# Patient Record
Sex: Female | Born: 1945 | Race: Black or African American | Hispanic: No | Smoking: Current some day smoker
Health system: Southern US, Community
[De-identification: ages and names within clinical notes are randomized; demographics above are authoritative.]

## PROBLEM LIST (undated history)

## (undated) DIAGNOSIS — E7800 Pure hypercholesterolemia, unspecified: Secondary | ICD-10-CM

## (undated) DIAGNOSIS — M810 Age-related osteoporosis without current pathological fracture: Secondary | ICD-10-CM

## (undated) DIAGNOSIS — I10 Essential (primary) hypertension: Secondary | ICD-10-CM

## (undated) DIAGNOSIS — E079 Disorder of thyroid, unspecified: Secondary | ICD-10-CM

## (undated) HISTORY — DX: Age-related osteoporosis without current pathological fracture: M81.0

## (undated) HISTORY — PX: ROTATOR CUFF REPAIR: SHX139

---

## 1983-08-02 HISTORY — PX: ABDOMINAL HYSTERECTOMY: SHX81

## 1997-10-13 ENCOUNTER — Other Ambulatory Visit: Admission: RE | Admit: 1997-10-13 | Discharge: 1997-10-13 | Payer: Self-pay | Admitting: *Deleted

## 1998-12-30 ENCOUNTER — Inpatient Hospital Stay (HOSPITAL_COMMUNITY): Admission: RE | Admit: 1998-12-30 | Discharge: 1999-01-01 | Payer: Self-pay | Admitting: *Deleted

## 1999-07-27 ENCOUNTER — Other Ambulatory Visit: Admission: RE | Admit: 1999-07-27 | Discharge: 1999-07-27 | Payer: Self-pay | Admitting: *Deleted

## 2000-03-31 ENCOUNTER — Ambulatory Visit (HOSPITAL_BASED_OUTPATIENT_CLINIC_OR_DEPARTMENT_OTHER): Admission: RE | Admit: 2000-03-31 | Discharge: 2000-04-01 | Payer: Self-pay | Admitting: Orthopedic Surgery

## 2000-08-15 ENCOUNTER — Other Ambulatory Visit: Admission: RE | Admit: 2000-08-15 | Discharge: 2000-08-15 | Payer: Self-pay | Admitting: *Deleted

## 2004-09-17 ENCOUNTER — Ambulatory Visit: Payer: Self-pay | Admitting: Internal Medicine

## 2004-09-20 ENCOUNTER — Other Ambulatory Visit: Admission: RE | Admit: 2004-09-20 | Discharge: 2004-09-20 | Payer: Self-pay | Admitting: *Deleted

## 2004-09-29 ENCOUNTER — Ambulatory Visit (HOSPITAL_COMMUNITY): Admission: RE | Admit: 2004-09-29 | Discharge: 2004-09-29 | Payer: Self-pay | Admitting: Internal Medicine

## 2004-10-05 ENCOUNTER — Ambulatory Visit: Payer: Self-pay | Admitting: Internal Medicine

## 2005-10-11 ENCOUNTER — Other Ambulatory Visit: Admission: RE | Admit: 2005-10-11 | Discharge: 2005-10-11 | Payer: Self-pay | Admitting: *Deleted

## 2006-04-27 ENCOUNTER — Encounter: Admission: RE | Admit: 2006-04-27 | Discharge: 2006-04-27 | Payer: Self-pay | Admitting: Nephrology

## 2006-12-27 ENCOUNTER — Other Ambulatory Visit: Admission: RE | Admit: 2006-12-27 | Discharge: 2006-12-27 | Payer: Self-pay | Admitting: *Deleted

## 2008-02-05 ENCOUNTER — Other Ambulatory Visit: Admission: RE | Admit: 2008-02-05 | Discharge: 2008-02-05 | Payer: Self-pay | Admitting: Gynecology

## 2009-02-25 ENCOUNTER — Emergency Department (HOSPITAL_COMMUNITY): Admission: EM | Admit: 2009-02-25 | Discharge: 2009-02-25 | Payer: Self-pay | Admitting: Emergency Medicine

## 2010-11-07 LAB — POCT INFECTIOUS MONO SCREEN: Mono Screen: NEGATIVE

## 2010-12-02 ENCOUNTER — Other Ambulatory Visit: Payer: Self-pay | Admitting: Orthopedic Surgery

## 2010-12-02 DIAGNOSIS — M25512 Pain in left shoulder: Secondary | ICD-10-CM

## 2010-12-07 ENCOUNTER — Ambulatory Visit
Admission: RE | Admit: 2010-12-07 | Discharge: 2010-12-07 | Disposition: A | Discharge status: Home / Self Care | Payer: PRIVATE HEALTH INSURANCE | Source: Ambulatory Visit | Attending: Orthopedic Surgery | Admitting: Orthopedic Surgery

## 2010-12-07 DIAGNOSIS — M25512 Pain in left shoulder: Secondary | ICD-10-CM

## 2010-12-17 NOTE — Op Note (Signed)
[place]  Patient:    Tabitha Weaver, Tabitha Weaver                          MRN: [id number] Proc. Date: 03/31/00 Adm. Date:  [id number] Disc. 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  OPERATION: 1. Right shoulder examination with manipulation under anesthesia. 2. Arthroscopy with debridement of labrum and rotator cuff. 3. Arthroscopic acromioplasty with acromioclavicular ligament release. 4. Excision distal clavicle and open repair of chronic rotator cuff tear.  SURGEON:  Loreta Ave, M.D.  ASSISTANT:  Arlys John D. Petrarca, P.A.-C.  ANESTHESIA:  General.  ESTIMATED BLOOD LOSS:  Minimal.  SPECIMENS:  None.  CULTURES:  None.  COMPLICATIONS:  None.  DRESSING:  Soft compressive with shoulder immobilizer.  DESCRIPTION OF PROCEDURE:  The patient brought to the operating room and placed on the operating table in the supine position.  After adequate anesthesia had been obtained, right shoulder examined.  Mild to moderate restriction of motion in all planes especially over head from adhesions. Manipulated achieving full motion without difficulty and without undue occurrence.  Placed in a beach chair position on the shoulder positioner, prepped and draped in the usual sterile fashion.  Three standard arthroscopic portals anterior posterior lateral.  Shoulder entered with blunt obturator and extended and inspected.  Remnants of adhesions debrided.  Extensive tearing labrum circumferentially all debrided to a stable surface.  Complete  tearing of the entire supraspinatus through the crescent up to the level of the cable. Fortunately not much retraction.  Debrided from below.  The articular cartilage looked good.  Labrum, biceps anchor, biceps tendon all intact after the labrum tear was debrided.  Capsular ligamentous structures normal.  After the shoulder had been cleared, cannula redirected subacrominally.  Full thickness tear confirmed.  Bursa resected.  Type II to III acromion. Converted to a type I acromion with shaver and high speed bur.  Distal clavicle exposed contributing to impingement and grade IV changes.  Laterally a centimeter sharply resected.  Adequacy of decompression and clavicle excision confirmed view from all portals.  Instruments and fluid removed.  Lateral portal turned into a deltoid splitting incision.  Cup was debrided.  The marked chronic changes in the tuberosity were removed with a bur.  Trough created in the humerus adjacent to the tear.  The wound was irrigated.  Three #2 Ethilon sutures were weaved into the cuff well medial to the cable portion.  These were then brought through drill holes at the trough with the concept repair system.  Sutures were brought through these and over tied over bone, to give me a nice firm water tight closure of the cuff without undue tension even with full motion.  Adequacy of decompression confirmed digitally at the time of cuff repair.  Wound irrigated.  Deltoid split, closed with 0 Vicryl.  Skin and subcutaneous tissue with Vicryl and Steri-Strips.  Portals closed with nylon. Margins of the wound were injected with Marcaine as was the shoulder and bursa.  Sterile compressive dressing and  shoulder immobilizer applied. Anesthesia reversed and brought to the recovery room.  Tolerated the surgery well with no complications. DD:  03/31/00 TD:  04/03/00 Job: [id number]

## 2012-10-11 ENCOUNTER — Ambulatory Visit (HOSPITAL_COMMUNITY): Payer: BC Managed Care – PPO | Attending: Cardiovascular Disease

## 2012-10-11 ENCOUNTER — Other Ambulatory Visit (HOSPITAL_COMMUNITY): Payer: Self-pay | Admitting: Internal Medicine

## 2012-10-11 DIAGNOSIS — R9431 Abnormal electrocardiogram [ECG] [EKG]: Secondary | ICD-10-CM | POA: Insufficient documentation

## 2012-10-11 DIAGNOSIS — E785 Hyperlipidemia, unspecified: Secondary | ICD-10-CM | POA: Insufficient documentation

## 2012-10-11 DIAGNOSIS — I079 Rheumatic tricuspid valve disease, unspecified: Secondary | ICD-10-CM | POA: Insufficient documentation

## 2012-10-11 NOTE — Progress Notes (Signed)
Echocardiogram performed.  

## 2012-10-12 ENCOUNTER — Encounter (HOSPITAL_COMMUNITY): Payer: Self-pay | Admitting: Internal Medicine

## 2013-03-30 ENCOUNTER — Emergency Department (INDEPENDENT_AMBULATORY_CARE_PROVIDER_SITE_OTHER)
Admission: EM | Admit: 2013-03-30 | Discharge: 2013-03-30 | Disposition: A | Discharge status: Home / Self Care | Payer: BC Managed Care – PPO | Source: Home / Self Care | Attending: Family Medicine | Admitting: Family Medicine

## 2013-03-30 ENCOUNTER — Emergency Department (INDEPENDENT_AMBULATORY_CARE_PROVIDER_SITE_OTHER): Payer: BC Managed Care – PPO

## 2013-03-30 ENCOUNTER — Encounter (HOSPITAL_COMMUNITY): Payer: Self-pay | Admitting: Emergency Medicine

## 2013-03-30 DIAGNOSIS — M7552 Bursitis of left shoulder: Secondary | ICD-10-CM

## 2013-03-30 DIAGNOSIS — M67919 Unspecified disorder of synovium and tendon, unspecified shoulder: Secondary | ICD-10-CM

## 2013-03-30 HISTORY — DX: Disorder of thyroid, unspecified: E07.9

## 2013-03-30 HISTORY — DX: Pure hypercholesterolemia, unspecified: E78.00

## 2013-03-30 HISTORY — DX: Essential (primary) hypertension: I10

## 2013-03-30 MED ORDER — DICLOFENAC SODIUM 1 % TD GEL: 4.0000 g | Freq: Four times a day (QID) | TRANSDERMAL | Status: DC

## 2013-03-30 MED ORDER — DICLOFENAC SODIUM 1 % TD GEL: 4.0000 g | Freq: Three times a day (TID) | TRANSDERMAL | Status: DC

## 2013-03-30 NOTE — Discharge Instructions (Signed)
Ice pack, sling and medicine as prescribed,see orthopedist as scheduled.

## 2013-03-30 NOTE — ED Notes (Signed)
Pt c/o right shoulder/arm pain onset 4 months... Hx of rotator cuff repair Pain is getting worse... Increases when she raises arm above head, localized fever Denies: inj/trauma, strenuous activity, fevers, tingly/numbness Alert w/no signs of acute distress.

## 2013-03-30 NOTE — ED Provider Notes (Signed)
CSN: [id number]     Arrival date & time 03/30/13  1633 History   First MD Initiated Contact with Patient 03/30/13 1650     Chief Complaint  Patient presents with  . Shoulder Pain   (Consider location/radiation/quality/duration/timing/severity/associated sxs/prior Treatment) Patient is a 67 y.o. female presenting with shoulder pain. The history is provided by the patient.  Shoulder Pain This is a chronic problem. The current episode started more than 1 week ago (106mo.). The problem has been gradually worsening. Associated symptoms comments: S/p rotator cuff surg years ago..    Past Medical History  Diagnosis Date  . Hypertension   . Thyroid disease   . High cholesterol    Past Surgical History  Procedure Laterality Date  . Rotator cuff repair Right    No family history on file. History  Substance Use Topics  . Smoking status: Never Smoker   . Smokeless tobacco: Not on file  . Alcohol Use: No   OB History   Grav Para Term Preterm Abortions TAB SAB Ect Mult Living                 Review of Systems  Constitutional: Negative.   Musculoskeletal: Positive for arthralgias. Negative for joint swelling.  Skin: Negative.     Allergies  Review of patient's allergies indicates no known allergies.  Home Medications   Current Outpatient Rx  Name  Route  Sig  Dispense  Refill  . atorvastatin (LIPITOR) 20 MG tablet   Oral   Take 20 mg by mouth daily.         . Ergocalciferol (VITAMIN D2 PO)   Oral   Take by mouth.         . levothyroxine (SYNTHROID, LEVOTHROID) 112 MCG tablet   Oral   Take 112 mcg by mouth daily before breakfast.         . losartan (COZAAR) 100 MG tablet   Oral   Take 100 mg by mouth daily.         . diclofenac sodium (VOLTAREN) 1 % GEL   Topical   Apply 4 g topically 3 (three) times daily with meals.   100 g   3    BP 114/72  Pulse 108  Temp(Src) 98.6 F (37 C) (Oral)  Resp 18  SpO2 100% Physical Exam  Nursing note and vitals  reviewed. Constitutional: She is oriented to person, place, and time. She appears well-developed and well-nourished.  Musculoskeletal: She exhibits tenderness.       Right shoulder: She exhibits decreased range of motion, tenderness, bony tenderness, swelling and pain. She exhibits no effusion, no crepitus, no deformity, normal pulse and normal strength.       Arms: Neurological: She is alert and oriented to person, place, and time.  Skin: Skin is warm and dry.    ED Course  Procedures (including critical care time) Labs Review Labs Reviewed - No data to display Imaging Review Dg Shoulder Right  03/30/2013   *RADIOLOGY REPORT*  Clinical Data: Right shoulder pain for 3 months.  No known injury.  RIGHT SHOULDER - 2+ VIEW  Comparison: None.  Findings: The humerus is located.  There is no fracture.  The patient appears to be status post resection of the acromioclavicular joint.  Imaged right lung and ribs are unremarkable.  IMPRESSION: No acute finding.   Original Report Authenticated By: Holley Dexter, M.D.    MDM   1. Bursitis of shoulder, left       Fayrene Fearing  Sallyanne Kuster, MD 03/30/13 4098

## 2013-05-07 ENCOUNTER — Ambulatory Visit: Payer: Medicare Other | Attending: Orthopedic Surgery

## 2013-05-07 DIAGNOSIS — M25519 Pain in unspecified shoulder: Secondary | ICD-10-CM | POA: Insufficient documentation

## 2013-05-07 DIAGNOSIS — IMO0001 Reserved for inherently not codable concepts without codable children: Secondary | ICD-10-CM | POA: Insufficient documentation

## 2013-05-07 DIAGNOSIS — M25619 Stiffness of unspecified shoulder, not elsewhere classified: Secondary | ICD-10-CM | POA: Insufficient documentation

## 2013-05-09 ENCOUNTER — Ambulatory Visit: Payer: No Typology Code available for payment source

## 2013-05-13 ENCOUNTER — Ambulatory Visit: Payer: Medicare Other

## 2013-05-20 ENCOUNTER — Ambulatory Visit: Payer: Medicare Other

## 2013-06-03 ENCOUNTER — Ambulatory Visit: Payer: Medicare Other

## 2013-06-10 ENCOUNTER — Ambulatory Visit: Payer: Medicare Other | Attending: Orthopedic Surgery

## 2013-06-10 DIAGNOSIS — M25519 Pain in unspecified shoulder: Secondary | ICD-10-CM | POA: Insufficient documentation

## 2013-06-10 DIAGNOSIS — IMO0001 Reserved for inherently not codable concepts without codable children: Secondary | ICD-10-CM | POA: Insufficient documentation

## 2013-06-10 DIAGNOSIS — M25619 Stiffness of unspecified shoulder, not elsewhere classified: Secondary | ICD-10-CM | POA: Insufficient documentation

## 2013-10-14 ENCOUNTER — Encounter: Payer: Self-pay | Admitting: Gastroenterology

## 2013-11-13 ENCOUNTER — Ambulatory Visit (AMBULATORY_SURGERY_CENTER): Payer: Commercial Managed Care - HMO | Admitting: *Deleted

## 2013-11-13 VITALS — Ht 67.8 in | Wt 297.6 lb

## 2013-11-13 DIAGNOSIS — Z1211 Encounter for screening for malignant neoplasm of colon: Secondary | ICD-10-CM

## 2013-11-13 MED ORDER — MOVIPREP 100 G PO SOLR: ORAL | Status: DC

## 2013-11-13 NOTE — Progress Notes (Signed)
No allergies to eggs or soy. No problems with anesthesia.  Pt given Emmi instructions for colonoscopy  No oxygen use  No diet drug use  

## 2013-11-27 ENCOUNTER — Encounter: Payer: Self-pay | Admitting: Gastroenterology

## 2013-11-27 ENCOUNTER — Ambulatory Visit (AMBULATORY_SURGERY_CENTER): Payer: Commercial Managed Care - HMO | Admitting: Gastroenterology

## 2013-11-27 VITALS — BP 140/84 | HR 64 | Temp 96.6°F | Resp 29 | Ht 67.0 in | Wt 297.0 lb

## 2013-11-27 DIAGNOSIS — Z1211 Encounter for screening for malignant neoplasm of colon: Secondary | ICD-10-CM

## 2013-11-27 DIAGNOSIS — K5730 Diverticulosis of large intestine without perforation or abscess without bleeding: Secondary | ICD-10-CM

## 2013-11-27 MED ORDER — SODIUM CHLORIDE 0.9 % IV SOLN: 500.0000 mL | INTRAVENOUS | Status: DC

## 2013-11-27 NOTE — Op Note (Signed)
[facility] [address] [place], [id number]   COLONOSCOPY PROCEDURE REPORT  PATIENT: Tabitha HammersmithLanier, Jaritza P.  MR#: [id number] BIRTHDATE: [birth year] , 67  yrs. old GENDER: Female ENDOSCOPIST: Rachael Feeaniel P Brennan Karam, MD REFERRED XB:JYNWGBY:Scott Link SnufferHolwerda, M.D. PROCEDURE DATE:  11/27/2013 PROCEDURE:   Colonoscopy, screening First Screening Colonoscopy - Avg.  risk and is 50 yrs.  old or older - No.  Prior Negative Screening - Now for repeat screening. 10 or more years since last screening  History of Adenoma - Now for follow-up colonoscopy & has been > or = to 3 yrs.  N/A  Polyps Removed Today? No.  Recommend repeat exam, <10 yrs? No. ASA CLASS:   Class II INDICATIONS:average risk screening. MEDICATIONS: MAC sedation, administered by CRNA and propofol (Diprivan) 350mg  IV  DESCRIPTION OF PROCEDURE:   After the risks benefits and alternatives of the procedure were thoroughly explained, informed consent was obtained.  A digital rectal exam revealed no abnormalities of the rectum.   The LB [id number]  endoscope was introduced through the anus and advanced to the cecum, which was identified by both the appendix and ileocecal valve. No adverse events experienced.   The quality of the prep was excellent.  The instrument was then slowly withdrawn as the colon was fully examined.   COLON FINDINGS: The sigmoid colon was quite tortuous and contained numerous diverticulum.  The examination was othewise normal. Retroflexed views revealed no abnormalities. The time to cecum=5 minutes 44 seconds.  Withdrawal time=7 minutes 49 seconds.  The scope was withdrawn and the procedure completed. COMPLICATIONS: There were no complications.  ENDOSCOPIC IMPRESSION: The sigmoid colon was quite tortuous and contained numerous diverticulum. The examination was othewise normal.  RECOMMENDATIONS: You should continue to follow colorectal cancer screening guidelines for "routine risk" patients with a  repeat colonoscopy in 10 years.   eSigned:  Rachael Feeaniel P Delwyn Scoggin, MD 11/27/2013 11:24 AM

## 2013-11-27 NOTE — Patient Instructions (Signed)
YOU HAD AN ENDOSCOPIC PROCEDURE TODAY AT THE [facility]: Refer to the procedure report that was given to you for any specific questions about what was found during the examination.  If the procedure report does not answer your questions, please call your gastroenterologist to clarify.  If you requested that your care partner not be given the details of your procedure findings, then the procedure report has been included in a sealed envelope for you to review at your convenience later.  YOU SHOULD EXPECT: Some feelings of bloating in the abdomen. Passage of more gas than usual.  Walking can help get rid of the air that was put into your GI tract during the procedure and reduce the bloating. If you had a lower endoscopy (such as a colonoscopy or flexible sigmoidoscopy) you may notice spotting of blood in your stool or on the toilet paper. If you underwent a bowel prep for your procedure, then you may not have a normal bowel movement for a few days.  DIET: Your first meal following the procedure should be a light meal and then it is ok to progress to your normal diet.  A half-sandwich or bowl of soup is an example of a good first meal.  Heavy or fried foods are harder to digest and may make you feel nauseous or bloated.  Likewise meals heavy in dairy and vegetables can cause extra gas to form and this can also increase the bloating.  Drink plenty of fluids but you should avoid alcoholic beverages for 24 hours.  ACTIVITY: Your care partner should take you home directly after the procedure.  You should plan to take it easy, moving slowly for the rest of the day.  You can resume normal activity the day after the procedure however you should NOT DRIVE or use heavy machinery for 24 hours (because of the sedation medicines used during the test).    SYMPTOMS TO REPORT IMMEDIATELY: A gastroenterologist can be reached at any hour.  During normal business hours, 8:30 AM to 5:00 PM Monday through Friday,  call [phone].  After hours and on weekends, please call the GI answering service at [phone] who will take a message and have the physician on call contact you.   Following lower endoscopy (colonoscopy or flexible sigmoidoscopy):  Excessive amounts of blood in the stool  Significant tenderness or worsening of abdominal pains  Swelling of the abdomen that is new, acute  Fever of 100F or higher    FOLLOW UP: If any biopsies were taken you will be contacted by phone or by letter within the next 1-3 weeks.  Call your gastroenterologist if you have not heard about the biopsies in 3 weeks.  Our staff will call the home number listed on your records the next business day following your procedure to check on you and address any questions or concerns that you may have at that time regarding the information given to you following your procedure. This is a courtesy call and so if there is no answer at the home number and we have not heard from you through the emergency physician on call, we will assume that you have returned to your regular daily activities without incident.  SIGNATURES/CONFIDENTIALITY: You and/or your care partner have signed paperwork which will be entered into your electronic medical record.  These signatures attest to the fact that that the information above on your After Visit Summary has been reviewed and is understood.  Full responsibility of the confidentiality   of this discharge information lies with you and/or your care-partner.   Information on diverticulosis given to you today   

## 2013-11-28 ENCOUNTER — Telehealth: Payer: Self-pay | Admitting: *Deleted

## 2013-11-28 NOTE — Telephone Encounter (Signed)
  Follow up Call-  Call back number 11/27/2013  Post procedure Call Back phone  # [phone]  Permission to leave phone message Yes     Patient questions:  Do you have a fever, pain , or abdominal swelling? no Pain Score  0 *  Have you tolerated food without any problems? yes  Have you been able to return to your normal activities? yes  Do you have any questions about your discharge instructions: Diet   no Medications  no Follow up visit  no  Do you have questions or concerns about your Care? no  Actions: * If pain score is 4 or above: No action needed, pain <4.

## 2015-10-15 DIAGNOSIS — E559 Vitamin D deficiency, unspecified: Secondary | ICD-10-CM | POA: Diagnosis not present

## 2015-10-15 DIAGNOSIS — E784 Other hyperlipidemia: Secondary | ICD-10-CM | POA: Diagnosis not present

## 2015-10-15 DIAGNOSIS — R739 Hyperglycemia, unspecified: Secondary | ICD-10-CM | POA: Diagnosis not present

## 2015-10-15 DIAGNOSIS — R8290 Unspecified abnormal findings in urine: Secondary | ICD-10-CM | POA: Diagnosis not present

## 2015-10-15 DIAGNOSIS — I10 Essential (primary) hypertension: Secondary | ICD-10-CM | POA: Diagnosis not present

## 2015-10-15 DIAGNOSIS — E038 Other specified hypothyroidism: Secondary | ICD-10-CM | POA: Diagnosis not present

## 2015-10-15 DIAGNOSIS — N390 Urinary tract infection, site not specified: Secondary | ICD-10-CM | POA: Diagnosis not present

## 2015-10-21 DIAGNOSIS — Z1389 Encounter for screening for other disorder: Secondary | ICD-10-CM | POA: Diagnosis not present

## 2015-10-21 DIAGNOSIS — E784 Other hyperlipidemia: Secondary | ICD-10-CM | POA: Diagnosis not present

## 2015-10-21 DIAGNOSIS — E559 Vitamin D deficiency, unspecified: Secondary | ICD-10-CM | POA: Diagnosis not present

## 2015-10-21 DIAGNOSIS — Z0000 Encounter for general adult medical examination without abnormal findings: Secondary | ICD-10-CM | POA: Diagnosis not present

## 2015-10-21 DIAGNOSIS — Z23 Encounter for immunization: Secondary | ICD-10-CM | POA: Diagnosis not present

## 2015-10-21 DIAGNOSIS — M810 Age-related osteoporosis without current pathological fracture: Secondary | ICD-10-CM | POA: Diagnosis not present

## 2015-10-21 DIAGNOSIS — Z111 Encounter for screening for respiratory tuberculosis: Secondary | ICD-10-CM | POA: Diagnosis not present

## 2015-10-21 DIAGNOSIS — R7309 Other abnormal glucose: Secondary | ICD-10-CM | POA: Diagnosis not present

## 2015-10-21 DIAGNOSIS — I10 Essential (primary) hypertension: Secondary | ICD-10-CM | POA: Diagnosis not present

## 2015-10-21 DIAGNOSIS — Z6835 Body mass index (BMI) 35.0-35.9, adult: Secondary | ICD-10-CM | POA: Diagnosis not present

## 2015-10-21 DIAGNOSIS — N951 Menopausal and female climacteric states: Secondary | ICD-10-CM | POA: Diagnosis not present

## 2015-10-21 DIAGNOSIS — E038 Other specified hypothyroidism: Secondary | ICD-10-CM | POA: Diagnosis not present

## 2016-03-08 ENCOUNTER — Other Ambulatory Visit: Payer: Self-pay | Admitting: Internal Medicine

## 2016-03-08 DIAGNOSIS — R911 Solitary pulmonary nodule: Secondary | ICD-10-CM

## 2016-03-09 ENCOUNTER — Ambulatory Visit
Admission: RE | Admit: 2016-03-09 | Discharge: 2016-03-09 | Disposition: A | Discharge status: Home / Self Care | Payer: PPO | Source: Ambulatory Visit | Attending: Internal Medicine | Admitting: Internal Medicine

## 2016-03-09 DIAGNOSIS — R911 Solitary pulmonary nodule: Secondary | ICD-10-CM

## 2016-03-14 DIAGNOSIS — H2513 Age-related nuclear cataract, bilateral: Secondary | ICD-10-CM | POA: Diagnosis not present

## 2016-03-14 DIAGNOSIS — H40033 Anatomical narrow angle, bilateral: Secondary | ICD-10-CM | POA: Diagnosis not present

## 2016-04-18 DIAGNOSIS — R600 Localized edema: Secondary | ICD-10-CM | POA: Diagnosis not present

## 2016-04-18 DIAGNOSIS — Z23 Encounter for immunization: Secondary | ICD-10-CM | POA: Diagnosis not present

## 2016-04-18 DIAGNOSIS — I10 Essential (primary) hypertension: Secondary | ICD-10-CM | POA: Diagnosis not present

## 2016-04-18 DIAGNOSIS — E784 Other hyperlipidemia: Secondary | ICD-10-CM | POA: Diagnosis not present

## 2016-04-18 DIAGNOSIS — E1165 Type 2 diabetes mellitus with hyperglycemia: Secondary | ICD-10-CM | POA: Diagnosis not present

## 2016-04-18 DIAGNOSIS — Z6836 Body mass index (BMI) 36.0-36.9, adult: Secondary | ICD-10-CM | POA: Diagnosis not present

## 2016-04-21 DIAGNOSIS — Z124 Encounter for screening for malignant neoplasm of cervix: Secondary | ICD-10-CM | POA: Diagnosis not present

## 2016-04-21 DIAGNOSIS — Z01419 Encounter for gynecological examination (general) (routine) without abnormal findings: Secondary | ICD-10-CM | POA: Diagnosis not present

## 2016-04-21 DIAGNOSIS — Z6836 Body mass index (BMI) 36.0-36.9, adult: Secondary | ICD-10-CM | POA: Diagnosis not present

## 2016-04-21 DIAGNOSIS — Z1231 Encounter for screening mammogram for malignant neoplasm of breast: Secondary | ICD-10-CM | POA: Diagnosis not present

## 2016-05-06 ENCOUNTER — Ambulatory Visit (INDEPENDENT_AMBULATORY_CARE_PROVIDER_SITE_OTHER): Payer: PPO | Admitting: Podiatry

## 2016-05-06 ENCOUNTER — Ambulatory Visit (INDEPENDENT_AMBULATORY_CARE_PROVIDER_SITE_OTHER): Payer: PPO

## 2016-05-06 ENCOUNTER — Encounter: Payer: Self-pay | Admitting: Podiatry

## 2016-05-06 VITALS — BP 125/68 | HR 82 | Resp 16 | Ht 66.0 in | Wt 223.0 lb

## 2016-05-06 DIAGNOSIS — G629 Polyneuropathy, unspecified: Secondary | ICD-10-CM

## 2016-05-06 DIAGNOSIS — M79671 Pain in right foot: Secondary | ICD-10-CM

## 2016-05-06 DIAGNOSIS — M79672 Pain in left foot: Secondary | ICD-10-CM

## 2016-05-06 DIAGNOSIS — M722 Plantar fascial fibromatosis: Secondary | ICD-10-CM

## 2016-05-06 MED ORDER — TRIAMCINOLONE ACETONIDE 10 MG/ML IJ SUSP
10.0000 mg | Freq: Once | INTRAMUSCULAR | Status: AC
  Administered 2016-05-06: 10 mg

## 2016-05-06 NOTE — Progress Notes (Signed)
   Subjective:    Patient ID: Tabitha HammersmithGail P Reidel, female    DOB: [birth year], 70 y.o.   MRN: [id number]  HPI Chief Complaint  Patient presents with  . Foot Pain    bilateral ... 'the whole foot"  tingling in toes and bottoms of feet x 1 mo.        Review of Systems  Neurological: Positive for numbness.  All other systems reviewed and are negative.      Objective:   Physical Exam        Assessment & Plan:

## 2016-05-08 NOTE — Progress Notes (Signed)
Subjective:     Patient ID: Tabitha Weaver, female   DOB: [birth year], 70 y.o.   MRN: [id number]  HPI patient presents stating I have a lot of pain in my feet with what appears to be mostly in the heels but also my forefoot and I do have a history of diabetes that I don't think is under control and I do not watch it carefully   Review of Systems  All other systems reviewed and are negative.      Objective:   Physical Exam  Constitutional: She is oriented to person, place, and time.  Cardiovascular: Intact distal pulses.   Musculoskeletal: Normal range of motion.  Neurological: She is oriented to person, place, and time.  Skin: Skin is warm and dry.  Nursing note and vitals reviewed.  neurovascular status was noted to be intact with muscle strength found to be within normal limits with flatfoot deformity noted and patient found to have tenderness in the fascial band of the heels bilateral with mild forefoot discomfort also noted but not to the same intensity. Patient's noted to have good digital perfusion and is well oriented 3     Assessment:     At risk patient with inflammatory fasciitis bilateral flatfoot deformity and hopeful compensation of the pain in the midfoot and forefoot bilateral    Plan:     H&P condition reviewed and explained diabetic foot inspections and education. Today I injected the plantar fascia bilateral 3 mg Kenalog 5 mg Xylocaine and gave instructions on anti-inflammatories dispensed fascial brace is bilateral and instructed on physical therapy. Reappoint as symptoms indicate  X-rays indicated spur formation severe flattening of the arch with digital deformities noted

## 2016-05-09 DIAGNOSIS — J329 Chronic sinusitis, unspecified: Secondary | ICD-10-CM | POA: Diagnosis not present

## 2016-05-09 DIAGNOSIS — J029 Acute pharyngitis, unspecified: Secondary | ICD-10-CM | POA: Diagnosis not present

## 2016-05-26 ENCOUNTER — Encounter: Payer: Self-pay | Admitting: Podiatry

## 2016-05-26 ENCOUNTER — Ambulatory Visit (INDEPENDENT_AMBULATORY_CARE_PROVIDER_SITE_OTHER): Payer: PPO | Admitting: Podiatry

## 2016-05-26 DIAGNOSIS — M722 Plantar fascial fibromatosis: Secondary | ICD-10-CM

## 2016-05-26 DIAGNOSIS — G629 Polyneuropathy, unspecified: Secondary | ICD-10-CM

## 2016-05-26 DIAGNOSIS — M2040 Other hammer toe(s) (acquired), unspecified foot: Secondary | ICD-10-CM

## 2016-05-26 MED ORDER — GABAPENTIN 300 MG PO CAPS: 300.0000 mg | ORAL_CAPSULE | Freq: Three times a day (TID) | ORAL | 3 refills | Status: DC

## 2016-06-05 NOTE — Progress Notes (Signed)
Subjective:     Patient ID: Tabitha Weaver, female   DOB: Oct 20, 1945, 70 y.o.   MRN: [id number]  HPI patient presents stating that she still having a lot of pain and at this time the injections did not give her benefit   Review of Systems     Objective:   Physical Exam Neurovascular status unchanged with patient noted to have discomfort which is over a wide distribution with moderate heel pain arch pain and tingling-like discomfort with negative Tinel's sign noted    Assessment:     Probability for neuropathy versus acute inflammation or other condition with long-term diabetes    Plan:     I reviewed condition and at this point I do think consideration is there for trying gabapentin explaining to her risk and complications. She wants to try this will start her slowly at night and that I want her to build during the day to 3 a day and we also may need to consider neurological consult if symptoms were to persist or progress

## 2016-07-19 DIAGNOSIS — E038 Other specified hypothyroidism: Secondary | ICD-10-CM | POA: Diagnosis not present

## 2016-07-19 DIAGNOSIS — Z6833 Body mass index (BMI) 33.0-33.9, adult: Secondary | ICD-10-CM | POA: Diagnosis not present

## 2016-07-19 DIAGNOSIS — E1140 Type 2 diabetes mellitus with diabetic neuropathy, unspecified: Secondary | ICD-10-CM | POA: Diagnosis not present

## 2016-07-19 DIAGNOSIS — E1165 Type 2 diabetes mellitus with hyperglycemia: Secondary | ICD-10-CM | POA: Diagnosis not present

## 2016-07-19 DIAGNOSIS — E784 Other hyperlipidemia: Secondary | ICD-10-CM | POA: Diagnosis not present

## 2016-07-19 DIAGNOSIS — I10 Essential (primary) hypertension: Secondary | ICD-10-CM | POA: Diagnosis not present

## 2016-08-11 ENCOUNTER — Ambulatory Visit: Payer: PPO | Admitting: *Deleted

## 2016-08-11 DIAGNOSIS — G629 Polyneuropathy, unspecified: Secondary | ICD-10-CM

## 2016-08-11 NOTE — Progress Notes (Signed)
Patient ID: Tabitha HammersmithGail P Eller, female   DOB: 09-03-1945, 71 y.o.   MRN: [id number]   Patient presents to be scanned and measured for diabetic shoes and inserts.

## 2016-09-14 ENCOUNTER — Ambulatory Visit (INDEPENDENT_AMBULATORY_CARE_PROVIDER_SITE_OTHER): Payer: PPO | Admitting: Podiatry

## 2016-09-14 DIAGNOSIS — M2040 Other hammer toe(s) (acquired), unspecified foot: Secondary | ICD-10-CM

## 2016-09-14 DIAGNOSIS — E1142 Type 2 diabetes mellitus with diabetic polyneuropathy: Secondary | ICD-10-CM

## 2016-09-14 DIAGNOSIS — G629 Polyneuropathy, unspecified: Secondary | ICD-10-CM | POA: Diagnosis not present

## 2016-09-14 DIAGNOSIS — L84 Corns and callosities: Secondary | ICD-10-CM

## 2016-09-14 NOTE — Progress Notes (Signed)
Patient presents for diabetic shoe pick up, shoes are tried on for good fit.  Patient received 1 Pair and 3 pairs custom molded diabetic inserts.  Verbal and written break in and wear instructions given.  Patient will follow up for scheduled routine care.   

## 2016-09-14 NOTE — Patient Instructions (Signed)

## 2016-10-27 DIAGNOSIS — E038 Other specified hypothyroidism: Secondary | ICD-10-CM | POA: Diagnosis not present

## 2016-10-27 DIAGNOSIS — E559 Vitamin D deficiency, unspecified: Secondary | ICD-10-CM | POA: Diagnosis not present

## 2016-10-27 DIAGNOSIS — R8299 Other abnormal findings in urine: Secondary | ICD-10-CM | POA: Diagnosis not present

## 2016-10-27 DIAGNOSIS — E784 Other hyperlipidemia: Secondary | ICD-10-CM | POA: Diagnosis not present

## 2016-10-27 DIAGNOSIS — N390 Urinary tract infection, site not specified: Secondary | ICD-10-CM | POA: Diagnosis not present

## 2016-10-27 DIAGNOSIS — E1165 Type 2 diabetes mellitus with hyperglycemia: Secondary | ICD-10-CM | POA: Diagnosis not present

## 2016-10-27 DIAGNOSIS — I10 Essential (primary) hypertension: Secondary | ICD-10-CM | POA: Diagnosis not present

## 2016-11-03 DIAGNOSIS — E038 Other specified hypothyroidism: Secondary | ICD-10-CM | POA: Diagnosis not present

## 2016-11-03 DIAGNOSIS — M25562 Pain in left knee: Secondary | ICD-10-CM | POA: Diagnosis not present

## 2016-11-03 DIAGNOSIS — Z0000 Encounter for general adult medical examination without abnormal findings: Secondary | ICD-10-CM | POA: Diagnosis not present

## 2016-11-03 DIAGNOSIS — E559 Vitamin D deficiency, unspecified: Secondary | ICD-10-CM | POA: Diagnosis not present

## 2016-11-03 DIAGNOSIS — Z6833 Body mass index (BMI) 33.0-33.9, adult: Secondary | ICD-10-CM | POA: Diagnosis not present

## 2016-11-03 DIAGNOSIS — R911 Solitary pulmonary nodule: Secondary | ICD-10-CM | POA: Diagnosis not present

## 2016-11-03 DIAGNOSIS — E1140 Type 2 diabetes mellitus with diabetic neuropathy, unspecified: Secondary | ICD-10-CM | POA: Diagnosis not present

## 2016-11-03 DIAGNOSIS — I10 Essential (primary) hypertension: Secondary | ICD-10-CM | POA: Diagnosis not present

## 2016-11-03 DIAGNOSIS — E784 Other hyperlipidemia: Secondary | ICD-10-CM | POA: Diagnosis not present

## 2016-11-03 DIAGNOSIS — Z1389 Encounter for screening for other disorder: Secondary | ICD-10-CM | POA: Diagnosis not present

## 2016-11-03 DIAGNOSIS — M25551 Pain in right hip: Secondary | ICD-10-CM | POA: Diagnosis not present

## 2016-11-10 DIAGNOSIS — Z1212 Encounter for screening for malignant neoplasm of rectum: Secondary | ICD-10-CM | POA: Diagnosis not present

## 2017-02-14 DIAGNOSIS — E038 Other specified hypothyroidism: Secondary | ICD-10-CM | POA: Diagnosis not present

## 2017-02-14 DIAGNOSIS — Z6832 Body mass index (BMI) 32.0-32.9, adult: Secondary | ICD-10-CM | POA: Diagnosis not present

## 2017-02-14 DIAGNOSIS — E784 Other hyperlipidemia: Secondary | ICD-10-CM | POA: Diagnosis not present

## 2017-02-14 DIAGNOSIS — M25562 Pain in left knee: Secondary | ICD-10-CM | POA: Diagnosis not present

## 2017-02-14 DIAGNOSIS — I10 Essential (primary) hypertension: Secondary | ICD-10-CM | POA: Diagnosis not present

## 2017-02-14 DIAGNOSIS — N951 Menopausal and female climacteric states: Secondary | ICD-10-CM | POA: Diagnosis not present

## 2017-02-14 DIAGNOSIS — E1140 Type 2 diabetes mellitus with diabetic neuropathy, unspecified: Secondary | ICD-10-CM | POA: Diagnosis not present

## 2017-03-13 DIAGNOSIS — H40033 Anatomical narrow angle, bilateral: Secondary | ICD-10-CM | POA: Diagnosis not present

## 2017-03-13 DIAGNOSIS — E119 Type 2 diabetes mellitus without complications: Secondary | ICD-10-CM | POA: Diagnosis not present

## 2017-04-26 DIAGNOSIS — R232 Flushing: Secondary | ICD-10-CM | POA: Diagnosis not present

## 2017-04-26 DIAGNOSIS — Z01419 Encounter for gynecological examination (general) (routine) without abnormal findings: Secondary | ICD-10-CM | POA: Diagnosis not present

## 2017-04-26 DIAGNOSIS — Z6833 Body mass index (BMI) 33.0-33.9, adult: Secondary | ICD-10-CM | POA: Diagnosis not present

## 2017-04-26 DIAGNOSIS — Z1231 Encounter for screening mammogram for malignant neoplasm of breast: Secondary | ICD-10-CM | POA: Diagnosis not present

## 2017-04-26 DIAGNOSIS — M8588 Other specified disorders of bone density and structure, other site: Secondary | ICD-10-CM | POA: Diagnosis not present

## 2017-04-26 DIAGNOSIS — N958 Other specified menopausal and perimenopausal disorders: Secondary | ICD-10-CM | POA: Diagnosis not present

## 2017-05-08 DIAGNOSIS — E7849 Other hyperlipidemia: Secondary | ICD-10-CM | POA: Diagnosis not present

## 2017-05-08 DIAGNOSIS — I10 Essential (primary) hypertension: Secondary | ICD-10-CM | POA: Diagnosis not present

## 2017-05-08 DIAGNOSIS — Z23 Encounter for immunization: Secondary | ICD-10-CM | POA: Diagnosis not present

## 2017-05-08 DIAGNOSIS — Z6833 Body mass index (BMI) 33.0-33.9, adult: Secondary | ICD-10-CM | POA: Diagnosis not present

## 2017-05-08 DIAGNOSIS — E119 Type 2 diabetes mellitus without complications: Secondary | ICD-10-CM | POA: Diagnosis not present

## 2017-08-15 DIAGNOSIS — E1140 Type 2 diabetes mellitus with diabetic neuropathy, unspecified: Secondary | ICD-10-CM | POA: Diagnosis not present

## 2017-08-15 DIAGNOSIS — E7849 Other hyperlipidemia: Secondary | ICD-10-CM | POA: Diagnosis not present

## 2017-08-15 DIAGNOSIS — I10 Essential (primary) hypertension: Secondary | ICD-10-CM | POA: Diagnosis not present

## 2017-08-15 DIAGNOSIS — J328 Other chronic sinusitis: Secondary | ICD-10-CM | POA: Diagnosis not present

## 2017-08-15 DIAGNOSIS — Z6833 Body mass index (BMI) 33.0-33.9, adult: Secondary | ICD-10-CM | POA: Diagnosis not present

## 2017-10-30 DIAGNOSIS — E559 Vitamin D deficiency, unspecified: Secondary | ICD-10-CM | POA: Diagnosis not present

## 2017-10-30 DIAGNOSIS — E7849 Other hyperlipidemia: Secondary | ICD-10-CM | POA: Diagnosis not present

## 2017-10-30 DIAGNOSIS — R82998 Other abnormal findings in urine: Secondary | ICD-10-CM | POA: Diagnosis not present

## 2017-10-30 DIAGNOSIS — E119 Type 2 diabetes mellitus without complications: Secondary | ICD-10-CM | POA: Diagnosis not present

## 2017-10-30 DIAGNOSIS — E038 Other specified hypothyroidism: Secondary | ICD-10-CM | POA: Diagnosis not present

## 2017-11-06 DIAGNOSIS — E038 Other specified hypothyroidism: Secondary | ICD-10-CM | POA: Diagnosis not present

## 2017-11-06 DIAGNOSIS — E1169 Type 2 diabetes mellitus with other specified complication: Secondary | ICD-10-CM | POA: Diagnosis not present

## 2017-11-06 DIAGNOSIS — I10 Essential (primary) hypertension: Secondary | ICD-10-CM | POA: Diagnosis not present

## 2017-11-06 DIAGNOSIS — Z6833 Body mass index (BMI) 33.0-33.9, adult: Secondary | ICD-10-CM | POA: Diagnosis not present

## 2017-11-06 DIAGNOSIS — I517 Cardiomegaly: Secondary | ICD-10-CM | POA: Diagnosis not present

## 2017-11-06 DIAGNOSIS — R911 Solitary pulmonary nodule: Secondary | ICD-10-CM | POA: Diagnosis not present

## 2017-11-06 DIAGNOSIS — E559 Vitamin D deficiency, unspecified: Secondary | ICD-10-CM | POA: Diagnosis not present

## 2017-11-06 DIAGNOSIS — Z1389 Encounter for screening for other disorder: Secondary | ICD-10-CM | POA: Diagnosis not present

## 2017-11-06 DIAGNOSIS — Z0000 Encounter for general adult medical examination without abnormal findings: Secondary | ICD-10-CM | POA: Diagnosis not present

## 2017-11-06 DIAGNOSIS — E668 Other obesity: Secondary | ICD-10-CM | POA: Diagnosis not present

## 2017-11-06 DIAGNOSIS — M810 Age-related osteoporosis without current pathological fracture: Secondary | ICD-10-CM | POA: Diagnosis not present

## 2017-11-06 DIAGNOSIS — E7849 Other hyperlipidemia: Secondary | ICD-10-CM | POA: Diagnosis not present

## 2017-11-21 DIAGNOSIS — Z1212 Encounter for screening for malignant neoplasm of rectum: Secondary | ICD-10-CM | POA: Diagnosis not present

## 2018-03-15 DIAGNOSIS — K069 Disorder of gingiva and edentulous alveolar ridge, unspecified: Secondary | ICD-10-CM | POA: Diagnosis not present

## 2018-03-15 DIAGNOSIS — E1140 Type 2 diabetes mellitus with diabetic neuropathy, unspecified: Secondary | ICD-10-CM | POA: Diagnosis not present

## 2018-03-15 DIAGNOSIS — Z6830 Body mass index (BMI) 30.0-30.9, adult: Secondary | ICD-10-CM | POA: Diagnosis not present

## 2018-03-15 DIAGNOSIS — E668 Other obesity: Secondary | ICD-10-CM | POA: Diagnosis not present

## 2018-03-15 DIAGNOSIS — I10 Essential (primary) hypertension: Secondary | ICD-10-CM | POA: Diagnosis not present

## 2018-03-15 DIAGNOSIS — E7849 Other hyperlipidemia: Secondary | ICD-10-CM | POA: Diagnosis not present

## 2018-03-15 DIAGNOSIS — E1169 Type 2 diabetes mellitus with other specified complication: Secondary | ICD-10-CM | POA: Diagnosis not present

## 2018-05-05 DIAGNOSIS — Z23 Encounter for immunization: Secondary | ICD-10-CM | POA: Diagnosis not present

## 2018-05-16 DIAGNOSIS — Z1272 Encounter for screening for malignant neoplasm of vagina: Secondary | ICD-10-CM | POA: Diagnosis not present

## 2018-05-16 DIAGNOSIS — Z124 Encounter for screening for malignant neoplasm of cervix: Secondary | ICD-10-CM | POA: Diagnosis not present

## 2018-06-04 DIAGNOSIS — H40033 Anatomical narrow angle, bilateral: Secondary | ICD-10-CM | POA: Diagnosis not present

## 2018-06-04 DIAGNOSIS — E119 Type 2 diabetes mellitus without complications: Secondary | ICD-10-CM | POA: Diagnosis not present

## 2018-07-13 DIAGNOSIS — Z6828 Body mass index (BMI) 28.0-28.9, adult: Secondary | ICD-10-CM | POA: Diagnosis not present

## 2018-07-13 DIAGNOSIS — E1169 Type 2 diabetes mellitus with other specified complication: Secondary | ICD-10-CM | POA: Diagnosis not present

## 2018-07-13 DIAGNOSIS — I10 Essential (primary) hypertension: Secondary | ICD-10-CM | POA: Diagnosis not present

## 2018-08-31 ENCOUNTER — Other Ambulatory Visit: Payer: Self-pay

## 2018-08-31 NOTE — Patient Outreach (Deleted)
  [facility]) Care Management Chronic Special Needs Program  08/31/2018  Name: Tabitha HammersmithGail P Griffee DOB: 1945-09-08  MRN: [id number]  Ms. [name] is enrolled in a chronic special needs plan for Diabetes. Client called with no answer No answer and unable to leave message Plan for 2nd outreach call in one week Chronic care management coordinator will attempt outreach in 1 week.   Dudley MajorMelissa Niyah Mamaril RN, Maximiano CossBSN,CCM, CDE Chronic Care Management Coordinator [facility] Care Management [phone]

## 2018-08-31 NOTE — Addendum Note (Signed)
Addended by: Yetta Glassman on: 08/31/2018 12:22 PM   Modules accepted: Orders

## 2018-08-31 NOTE — Patient Outreach (Signed)
[facility]) Care Management Chronic Special Needs Program  08/31/2018  Name: Tabitha Weaver DOB: Feb 14, 1946  MRN: [id number]  Tabitha Weaver is enrolled in a chronic special needs plan for Diabetes. Chronic Care Management Coordinator telephoned client to review health risk assessment and to develop individualized care plan.  Introduced the chronic care management program, importance of client participation, and taking their care plan to all provider appointments and inpatient facilities.  Reviewed the transition of care process and possible referral to community care management. Client called back from looking at caller-ID  Subjective: Client states she goes to the [facility] 3-4 times a week and she walks on other days.  States she would like to get off of her medication for diabetes if possible.  States she has lost weight and she is watching what she eats.  Goals Addressed            This Visit's Progress   .  Acknowledge receipt of [name], broadcasting/film/video      . Advanced Care Planning complete by 9 months       [name] will call you to give you information about Advanced Directives    . Client understands the importance of follow-up with providers by attending scheduled visits      . Client will use Assistive Devices as needed and verbalize understanding of device use      . Client will verbalize knowledge of self management of Hypertension as evidences by BP reading of 140/90 or less; or as defined by provider      . Decrease inpatient admissions/ readmissions with in the next year      . Decrease inpatient diabetes admissions/readmissions with in the year      . Decrease the use of hospital emergency department related to diabetes within the next year       . Diabetes Patient stated goal to decrease diabetes pill to once a day  in the next 9 months (pt-stated)       Diabetes self management actions:  Glucose monitoring per provider  recommendations  Perform Quality checks on blood meter  Eat Healthy  Check feet daily  Visit provider every 3-6 months as directed  Hbg A1C level every 3-6 months.  Eye Exam yearly    . HEMOGLOBIN A1C < 7.0      . Maintain timely refills of diabetic medication as prescribed within the year .      Marland Kitchen Obtain annual  Lipid Profile, LDL-C      . Obtain Annual Eye (retinal)  Exam       . Obtain Annual Foot Exam      . Obtain annual screen for micro albuminuria (urine) , nephropathy (kidney problems)      . Obtain Hemoglobin A1C at least 2 times per year      . Visit Primary Care Provider or Endocrinologist at least 2 times per year        Client is meeting diabetes self management goal with last hemoglobin A1C of 5.6%.  Client does not have Advanced directives and is agreeable to referral to Social work  Plan:  Send successful outreach letter with a copy of their individualized care plan, Send individual care plan to provider and Send educational material  Chronic care management coordination will outreach in:  9 Months  Will refer client to:  Social Work   Peter Garter RN, [employer], [place] Management (  [phone]  

## 2018-09-04 ENCOUNTER — Other Ambulatory Visit: Payer: Self-pay

## 2018-09-04 NOTE — Patient Outreach (Signed)
[facility]) Care Management  09/04/2018  NENE STINGLE 1946-04-03 [id number]   Unsuccessful outreach regarding social work referral for assistance with Advance Directives.  BSW attempted to reach Ms. Corales multiple times today but got a busy signal each time.  Unsuccessful outreach letter mailed.  Will attempt to reach her again within four business days.  Malachy Chamber, BSW Social Worker [phone]

## 2018-09-06 ENCOUNTER — Ambulatory Visit: Payer: Self-pay

## 2018-09-07 ENCOUNTER — Ambulatory Visit: Payer: Self-pay

## 2018-09-07 ENCOUNTER — Other Ambulatory Visit: Payer: Self-pay

## 2018-09-07 ENCOUNTER — Ambulatory Visit: Payer: PPO

## 2018-09-07 NOTE — Patient Outreach (Signed)
[facility]) Care Management  09/07/2018  Tabitha Weaver June 29, 1946 [id number]   Second unsuccessful outreach regarding social work referral for assistance with [employer].  No option to leave voicemail message.   Unsuccessful outreach letter mailed on 09/04/18.  Will attempt to reach her again within four business days.  Malachy Chamber, BSW Social Worker [phone]

## 2018-09-10 ENCOUNTER — Other Ambulatory Visit: Payer: Self-pay

## 2018-09-10 NOTE — Patient Outreach (Signed)
[facility]) Care Management  09/10/2018  Tabitha Weaver Sep 26, 1945 [id number]   Successful outreach to Tabitha Weaver regarding social work referral for information on [employer].  BSW educated Tabitha Weaver about [facility] POA and Living Will.  BSW agreed to send Advance Directive EMMI and packet.  BSW will follow up within the next two weeks to ensure receipt of information and to further review if requested by patient.  Malachy Chamber, BSW Social Worker [phone]

## 2018-09-24 ENCOUNTER — Ambulatory Visit: Payer: Self-pay

## 2018-09-25 ENCOUNTER — Other Ambulatory Visit: Payer: Self-pay

## 2018-09-25 NOTE — Patient Outreach (Signed)
[facility]) Care Management  09/25/2018  FATUMATA KOLTUN 1945-12-12 [id number]   Successful follow up call to Ms. Darl Pikes regarding social work referral for assistance with [employer]chant [employer].  Ms. Mcwhite confirmed receipt of packet and EMMI but said that she has not reviewed all of the information.  She requested a return call within the next week so that she can review the information and prepare any questions she may have.  Malachy Chamber, BSW Social Worker [phone]

## 2018-10-02 ENCOUNTER — Other Ambulatory Visit: Payer: Self-pay

## 2018-10-02 ENCOUNTER — Ambulatory Visit: Payer: Self-pay

## 2018-10-02 NOTE — Patient Outreach (Addendum)
[facility]) Care Management  10/02/2018  Tabitha Weaver 05-23-1946 [id number]   Unsuccessful outreach to patient to review Advance Directives.  No option to leave voicemail message.  BSW will attempt to reach again within four business days.  Addendum: Return call from patient.  She reported that she and her children intend to review and complete Advance Directives.  She denied any questions at this time.  BSW is closing case but encouraged patient to call if any questions or additional needs arise.  Malachy Chamber, BSW Social Worker [phone]

## 2018-10-05 ENCOUNTER — Ambulatory Visit: Payer: Self-pay

## 2018-11-12 DIAGNOSIS — E7849 Other hyperlipidemia: Secondary | ICD-10-CM | POA: Diagnosis not present

## 2018-11-12 DIAGNOSIS — E559 Vitamin D deficiency, unspecified: Secondary | ICD-10-CM | POA: Diagnosis not present

## 2018-11-12 DIAGNOSIS — I10 Essential (primary) hypertension: Secondary | ICD-10-CM | POA: Diagnosis not present

## 2018-11-12 DIAGNOSIS — E1169 Type 2 diabetes mellitus with other specified complication: Secondary | ICD-10-CM | POA: Diagnosis not present

## 2018-11-12 DIAGNOSIS — E038 Other specified hypothyroidism: Secondary | ICD-10-CM | POA: Diagnosis not present

## 2018-11-15 DIAGNOSIS — E1169 Type 2 diabetes mellitus with other specified complication: Secondary | ICD-10-CM | POA: Diagnosis not present

## 2018-11-15 DIAGNOSIS — J329 Chronic sinusitis, unspecified: Secondary | ICD-10-CM | POA: Diagnosis not present

## 2018-11-15 DIAGNOSIS — Z0000 Encounter for general adult medical examination without abnormal findings: Secondary | ICD-10-CM | POA: Diagnosis not present

## 2018-11-15 DIAGNOSIS — E669 Obesity, unspecified: Secondary | ICD-10-CM | POA: Diagnosis not present

## 2018-11-15 DIAGNOSIS — E039 Hypothyroidism, unspecified: Secondary | ICD-10-CM | POA: Diagnosis not present

## 2018-11-15 DIAGNOSIS — R911 Solitary pulmonary nodule: Secondary | ICD-10-CM | POA: Diagnosis not present

## 2018-11-15 DIAGNOSIS — I10 Essential (primary) hypertension: Secondary | ICD-10-CM | POA: Diagnosis not present

## 2018-11-15 DIAGNOSIS — M810 Age-related osteoporosis without current pathological fracture: Secondary | ICD-10-CM | POA: Diagnosis not present

## 2018-11-15 DIAGNOSIS — E785 Hyperlipidemia, unspecified: Secondary | ICD-10-CM | POA: Diagnosis not present

## 2018-11-15 DIAGNOSIS — E1121 Type 2 diabetes mellitus with diabetic nephropathy: Secondary | ICD-10-CM | POA: Diagnosis not present

## 2018-11-15 DIAGNOSIS — E559 Vitamin D deficiency, unspecified: Secondary | ICD-10-CM | POA: Diagnosis not present

## 2018-11-15 DIAGNOSIS — R809 Proteinuria, unspecified: Secondary | ICD-10-CM | POA: Diagnosis not present

## 2019-01-09 ENCOUNTER — Other Ambulatory Visit: Payer: Self-pay | Admitting: *Deleted

## 2019-01-09 DIAGNOSIS — Z20822 Contact with and (suspected) exposure to covid-19: Secondary | ICD-10-CM

## 2019-01-10 LAB — NOVEL CORONAVIRUS, NAA: SARS-CoV-2, NAA: NOT DETECTED

## 2019-01-11 ENCOUNTER — Telehealth: Payer: Self-pay

## 2019-01-11 NOTE — Telephone Encounter (Signed)
Pt. Called back and given negative COVID 19 results.

## 2019-03-06 ENCOUNTER — Ambulatory Visit: Payer: HMO | Admitting: Podiatry

## 2019-03-06 ENCOUNTER — Encounter: Payer: Self-pay | Admitting: Podiatry

## 2019-03-06 ENCOUNTER — Other Ambulatory Visit: Payer: Self-pay

## 2019-03-06 VITALS — Temp 98.0°F

## 2019-03-06 DIAGNOSIS — E1142 Type 2 diabetes mellitus with diabetic polyneuropathy: Secondary | ICD-10-CM | POA: Diagnosis not present

## 2019-03-06 DIAGNOSIS — G629 Polyneuropathy, unspecified: Secondary | ICD-10-CM

## 2019-03-06 NOTE — Progress Notes (Addendum)
This patient presents to the office stating she is interested in diabetic shoes.  She received a pair 2 years ago.  She is a diabetic who has previously been treated with gabapentin.  She says she has pain in her bunions and is interested in obtaining a pair of diabetic shoes.  General Appearance  Alert, conversant and in no acute stress.  Vascular  Dorsalis pedis and posterior tibial  pulses are palpable  bilaterally.  Capillary return is within normal limits  bilaterally. Temperature is within normal limits  bilaterally.  Neurologic  Senn-Weinstein monofilament wire test diminished   bilaterally. Muscle power within normal limits bilaterally.  Nails Normotropic nails noted. No evidence of bacterial infection or drainage bilaterally.  Orthopedic  No limitations of motion of motion feet .  No crepitus or effusions noted.  Hammer toes  B/L  HAV  B/L  Skin  normotropic skin with no porokeratosis noted bilaterally.  No signs of infections or ulcers noted.   Diabetes with neuropathy.  ROV.    A diabetic foot exam was performed and there is no evidence of any vascular  pathology.   Diminished LOPS noted.  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  Patient to make an appointment with Liliane Channel.  RTC prn.   Gardiner Barefoot DPM

## 2019-03-20 ENCOUNTER — Other Ambulatory Visit: Payer: HMO | Admitting: Orthotics

## 2019-03-20 ENCOUNTER — Other Ambulatory Visit: Payer: Self-pay

## 2019-04-24 ENCOUNTER — Other Ambulatory Visit: Payer: Self-pay

## 2019-04-24 ENCOUNTER — Ambulatory Visit (INDEPENDENT_AMBULATORY_CARE_PROVIDER_SITE_OTHER): Payer: HMO | Admitting: Orthotics

## 2019-04-24 DIAGNOSIS — L84 Corns and callosities: Secondary | ICD-10-CM

## 2019-04-24 DIAGNOSIS — E1142 Type 2 diabetes mellitus with diabetic polyneuropathy: Secondary | ICD-10-CM | POA: Diagnosis not present

## 2019-04-24 DIAGNOSIS — M2040 Other hammer toe(s) (acquired), unspecified foot: Secondary | ICD-10-CM

## 2019-04-24 DIAGNOSIS — G629 Polyneuropathy, unspecified: Secondary | ICD-10-CM

## 2019-04-24 NOTE — Progress Notes (Signed)

## 2019-05-03 DIAGNOSIS — E119 Type 2 diabetes mellitus without complications: Secondary | ICD-10-CM | POA: Diagnosis not present

## 2019-05-03 DIAGNOSIS — H40033 Anatomical narrow angle, bilateral: Secondary | ICD-10-CM | POA: Diagnosis not present

## 2019-05-04 DIAGNOSIS — H04123 Dry eye syndrome of bilateral lacrimal glands: Secondary | ICD-10-CM | POA: Diagnosis not present

## 2019-05-21 DIAGNOSIS — E119 Type 2 diabetes mellitus without complications: Secondary | ICD-10-CM | POA: Insufficient documentation

## 2019-05-21 DIAGNOSIS — I10 Essential (primary) hypertension: Secondary | ICD-10-CM | POA: Insufficient documentation

## 2019-05-21 DIAGNOSIS — E785 Hyperlipidemia, unspecified: Secondary | ICD-10-CM | POA: Insufficient documentation

## 2019-05-22 DIAGNOSIS — Z6830 Body mass index (BMI) 30.0-30.9, adult: Secondary | ICD-10-CM | POA: Diagnosis not present

## 2019-05-22 DIAGNOSIS — Z01419 Encounter for gynecological examination (general) (routine) without abnormal findings: Secondary | ICD-10-CM | POA: Diagnosis not present

## 2019-05-22 DIAGNOSIS — N958 Other specified menopausal and perimenopausal disorders: Secondary | ICD-10-CM | POA: Diagnosis not present

## 2019-05-22 DIAGNOSIS — Z1231 Encounter for screening mammogram for malignant neoplasm of breast: Secondary | ICD-10-CM | POA: Diagnosis not present

## 2019-05-24 DIAGNOSIS — M79602 Pain in left arm: Secondary | ICD-10-CM | POA: Diagnosis not present

## 2019-05-24 DIAGNOSIS — M810 Age-related osteoporosis without current pathological fracture: Secondary | ICD-10-CM | POA: Diagnosis not present

## 2019-05-24 DIAGNOSIS — R809 Proteinuria, unspecified: Secondary | ICD-10-CM | POA: Diagnosis not present

## 2019-05-24 DIAGNOSIS — Z23 Encounter for immunization: Secondary | ICD-10-CM | POA: Diagnosis not present

## 2019-05-24 DIAGNOSIS — E1169 Type 2 diabetes mellitus with other specified complication: Secondary | ICD-10-CM | POA: Diagnosis not present

## 2019-05-24 DIAGNOSIS — E559 Vitamin D deficiency, unspecified: Secondary | ICD-10-CM | POA: Diagnosis not present

## 2019-05-24 DIAGNOSIS — I10 Essential (primary) hypertension: Secondary | ICD-10-CM | POA: Diagnosis not present

## 2019-05-24 DIAGNOSIS — E1140 Type 2 diabetes mellitus with diabetic neuropathy, unspecified: Secondary | ICD-10-CM | POA: Diagnosis not present

## 2019-05-24 DIAGNOSIS — E1121 Type 2 diabetes mellitus with diabetic nephropathy: Secondary | ICD-10-CM | POA: Diagnosis not present

## 2019-05-29 DIAGNOSIS — M79602 Pain in left arm: Secondary | ICD-10-CM | POA: Diagnosis not present

## 2019-05-30 ENCOUNTER — Other Ambulatory Visit: Payer: Self-pay

## 2019-05-30 NOTE — Patient Outreach (Signed)
[facility]) Care Management Chronic Special Needs Program  05/30/2019  Name: Tabitha Weaver DOB: 02-28-1946  MRN: [id number]  Ms. [name] is enrolled in a chronic special needs plan for Diabetes. Reviewed and updated care plan.  Subjective: Client states her diabetes is doing good.  States that she is only taking one Metformin pill a day.  States she blood sugars range 80-101 in the morning.  States she is walking about 1 hour most days.  States she is trying to watch her diet.  States her B/P is good.  States she has the Advanced Directives forms but she has not completed.  Goals Addressed            This Visit's Progress   . COMPLETED:  Acknowledge receipt of Advanced Directive package       Received forms    . Advanced Care Planning complete by 9 months(continue 05/30/19)   Not on track   . Client understands the importance of follow-up with providers by attending scheduled visits   On track   . Client will use Assistive Devices as needed and verbalize understanding of device use   On track   . Client will verbalize knowledge of self management of Hypertension as evidences by BP reading of 140/90 or less; or as defined by provider   On track   . Decrease inpatient admissions/ readmissions with in the next year   On track   . Decrease inpatient diabetes admissions/readmissions with in the year   On track   . Decrease the use of hospital emergency department related to diabetes within the next year    On track   . COMPLETED: Diabetes Patient stated goal to decrease diabetes pill to once a day  in the next 9 months (pt-stated)       Completed only taking one Metformin a day    . HEMOGLOBIN A1C < 7.0       Diabetes self management actions:  Glucose monitoring per provider recommendations  Perform Quality checks on blood meter  Eat Healthy  Check feet daily  Visit provider every 3-6 months as directed  Hbg A1C level every 3-6 months.  Eye Exam yearly    . Maintain timely refills of diabetic medication as prescribed within the year .   On track   . COMPLETED: Obtain annual  Lipid Profile, LDL-C       Completed 11/12/18    . COMPLETED: Obtain Annual Eye (retinal)  Exam        Completed in September    . COMPLETED: Obtain Annual Foot Exam       Completed 03/06/19    . COMPLETED: Obtain annual screen for micro albuminuria (urine) , nephropathy (kidney problems)       Completed 11/12/18    . COMPLETED: Obtain Hemoglobin A1C at least 2 times per year       Completed 11/12/18, 05/24/19    . COMPLETED: Visit Primary Care Provider or Endocrinologist at least 2 times per year        Completed 11/12/18, 05/24/19     Client is  meeting diabetes self management goal of hemoglobin A1C of <7% with last reading of 5.6% Reinforced to follow a low CHO, low sodium diet Encouraged to continue to walk daily Encouraged to complete her Advanced Directives  Reviewed number for 24-hour nurse Line Reviewed COVID 19 precautions  Plan:  Send successful outreach letter with a copy of their individualized care plan and Send individual  care plan to provider  Chronic care management coordinator will outreach in:  4-6 Months     Peter Garter RN, [facility] [place] Management Coordinator [place] Management [phone]

## 2019-10-10 ENCOUNTER — Ambulatory Visit: Payer: Self-pay

## 2019-11-12 ENCOUNTER — Other Ambulatory Visit: Payer: Self-pay

## 2019-11-12 NOTE — Patient Outreach (Signed)
[place] [facility]) Care Management Chronic Special Needs Program  11/12/2019  Name: Tabitha Weaver DOB: 15-Sep-1945  MRN: [id number]  Tabitha Weaver is enrolled in a chronic special needs plan for Diabetes. Chronic Care Management Coordinator telephoned client to review health risk assessment and to develop individualized care plan.  Reviewed the chronic care management program, importance of client participation, and taking their care plan to all provider appointments and inpatient facilities.  Reviewed the transition of care process and possible referral to community care management.  Subjective: Client states that she has been doing good.  States she is to see her doctor next week.  States she her blood sugars range from 87-150.  Denies any low blood sugars.  States that she is walking at least 60 minutes every day.  States she would like to get back to the [facility] and do water exercises when she feels it is safe enough.  States that she has had both of her COVID shots.  States she is eating healthy vegetables and rarely eats red meats.  States her B/P is good when she checks it and she avoiding salt in her diet.  States she has the Advanced Directives forms but has not completed yet  Goals Addressed            This Visit's Progress   . COMPLETED: Advanced Care Planning complete by 9 months(continue 05/30/19)   Not on track    Client has Advanced Directives forms and understands how to complete Goal not completed 11/12/19    . Client understands the importance of follow-up with providers by attending scheduled visits   On track    Plan to keep scheduled appointments with providers Reinforced to keep scheduled appointments with providers    . COMPLETED: Client will use Assistive Devices as needed and verbalize understanding of device use   On track    Reports using glucometer with no issues Goal completed 11/12/19    . Client will verbalize knowledge of self management of  Hypertension as evidences by BP reading of 140/90 or less; or as defined by provider   On track    Reports B/P less than 140/90 when checked Plan to check B/P regularly Take B/P medications as ordered Plan to follow a low salt diet  Maintain activity as tolerated Sent EMMI: High Blood Pressure (Hypertension) What you can do      . Decrease inpatient admissions/ readmissions with in the next year   On track    No admissions in 2020    . COMPLETED: Decrease inpatient diabetes admissions/readmissions with in the year   On track    No admissions in 2020    . Decrease the use of hospital emergency department related to diabetes within the next year    On track    No ED visits in 2020    . Diabetes Patient stated goal to get off of diabetes medications (pt-stated)       Discussed with client to discuss stopping Metformin at her next appointment if her next Hemoglobin A1C remains low. Reviewed to continue regular exercise and maintain her weight loss to keep her blood sugars lower    . HEMOGLOBIN A1C < 7.0       Last Hemoglobin A1C 5.6% Reviewed fasting blood sugar goals of 80-130 and less than 180 1 1/2-2 hours after meals Reinforced to follow a low carbohydrate low salt diet and to watch portion sizes Reviewed use and possible side effects of diabetes  medications  Reviewed diabetes action plan in [facility] calendar    . Maintain timely refills of diabetic medication as prescribed within the year .   On track    Maintaining timely refills of medications per dispense report Reinforced importance of getting medications refilled on time    . Obtain annual  Lipid Profile, LDL-C   On track    Last completed 11/12/18 LDL 85 The goal for LDL is less than 70 mg/dL as you are at high risk for complications Try to avoid saturated fats, trans-fats and eat more fiber Plan to take statin as ordered    . Obtain Annual Eye (retinal)  Exam    On track    Last completed 05/04/19 Plan to  have a dilated eye exam every year    . Obtain Annual Foot Exam   On track    Last completed 03/06/19 Check your skin and feet every day for cuts, bruises, redness, blisters, or sores. Report to provider any problems with your feet Schedule a foot exam with your health care provider once every year    . Obtain annual screen for micro albuminuria (urine) , nephropathy (kidney problems)   On track    Last completed 11/12/18 It is important for your doctor to check your urine for protein at least every year    . Obtain Hemoglobin A1C at least 2 times per year   On track    Last completed  05/24/19 It is important to have your Hemoglobin A1C checked every 6 months if you are at goal and every 3 months if you are not at goal    . Visit Primary Care Provider or Endocrinologist at least 2 times per year    On track    Last completed 05/24/19 Last Annual Wellness visit 05/28/20 Please schedule your annual wellness visit     Client is  meeting diabetes self management goal of hemoglobin A1C of <7% with last reading of 5.6% Reviewed number for 24-hour nurse Line Reviewed COVID 19 precautions Plan:  Send successful outreach letter with a copy of their individualized care plan, Send individual care plan to provider and Send educational material-EMMI-High Blood Pressure(Hypertension): What you can do  Chronic care management coordination will outreach in:  12 months     Dudley Major RN, Maximiano Coss, CDE Chronic Care Management Coordinator [facility] Care Management [phone]

## 2019-11-19 DIAGNOSIS — E7849 Other hyperlipidemia: Secondary | ICD-10-CM | POA: Diagnosis not present

## 2019-11-19 DIAGNOSIS — E1169 Type 2 diabetes mellitus with other specified complication: Secondary | ICD-10-CM | POA: Diagnosis not present

## 2019-11-19 DIAGNOSIS — M810 Age-related osteoporosis without current pathological fracture: Secondary | ICD-10-CM | POA: Diagnosis not present

## 2019-11-19 DIAGNOSIS — E038 Other specified hypothyroidism: Secondary | ICD-10-CM | POA: Diagnosis not present

## 2019-11-25 DIAGNOSIS — R809 Proteinuria, unspecified: Secondary | ICD-10-CM | POA: Diagnosis not present

## 2019-11-25 DIAGNOSIS — R82998 Other abnormal findings in urine: Secondary | ICD-10-CM | POA: Diagnosis not present

## 2019-11-25 DIAGNOSIS — Z1331 Encounter for screening for depression: Secondary | ICD-10-CM | POA: Diagnosis not present

## 2019-11-25 DIAGNOSIS — E1121 Type 2 diabetes mellitus with diabetic nephropathy: Secondary | ICD-10-CM | POA: Diagnosis not present

## 2019-11-25 DIAGNOSIS — E559 Vitamin D deficiency, unspecified: Secondary | ICD-10-CM | POA: Diagnosis not present

## 2019-11-25 DIAGNOSIS — E1169 Type 2 diabetes mellitus with other specified complication: Secondary | ICD-10-CM | POA: Diagnosis not present

## 2019-11-25 DIAGNOSIS — I10 Essential (primary) hypertension: Secondary | ICD-10-CM | POA: Diagnosis not present

## 2019-11-25 DIAGNOSIS — M810 Age-related osteoporosis without current pathological fracture: Secondary | ICD-10-CM | POA: Diagnosis not present

## 2019-11-25 DIAGNOSIS — F17200 Nicotine dependence, unspecified, uncomplicated: Secondary | ICD-10-CM | POA: Diagnosis not present

## 2019-11-25 DIAGNOSIS — E7849 Other hyperlipidemia: Secondary | ICD-10-CM | POA: Diagnosis not present

## 2019-11-25 DIAGNOSIS — Z0000 Encounter for general adult medical examination without abnormal findings: Secondary | ICD-10-CM | POA: Diagnosis not present

## 2019-11-25 DIAGNOSIS — E669 Obesity, unspecified: Secondary | ICD-10-CM | POA: Diagnosis not present

## 2019-11-26 DIAGNOSIS — Z1212 Encounter for screening for malignant neoplasm of rectum: Secondary | ICD-10-CM | POA: Diagnosis not present

## 2020-03-09 DIAGNOSIS — H40033 Anatomical narrow angle, bilateral: Secondary | ICD-10-CM | POA: Diagnosis not present

## 2020-03-09 DIAGNOSIS — H04123 Dry eye syndrome of bilateral lacrimal glands: Secondary | ICD-10-CM | POA: Diagnosis not present

## 2020-05-26 DIAGNOSIS — I10 Essential (primary) hypertension: Secondary | ICD-10-CM | POA: Diagnosis not present

## 2020-05-26 DIAGNOSIS — E039 Hypothyroidism, unspecified: Secondary | ICD-10-CM | POA: Diagnosis not present

## 2020-05-26 DIAGNOSIS — E785 Hyperlipidemia, unspecified: Secondary | ICD-10-CM | POA: Diagnosis not present

## 2020-05-26 DIAGNOSIS — E1121 Type 2 diabetes mellitus with diabetic nephropathy: Secondary | ICD-10-CM | POA: Diagnosis not present

## 2020-05-27 ENCOUNTER — Other Ambulatory Visit: Payer: Self-pay

## 2020-05-27 NOTE — Patient Outreach (Addendum)
  [facility]) Care Management Chronic Special Needs Program    05/27/2020  Name: DAYSI BOGGAN, DOB: July 21, 1946  MRN: [id number]   Ms. Aradhya Shellenbarger is enrolled in a chronic special needs plan for Diabetes.  Member will be followed by the [facility] Case Management team beginning August 01, 2020. Case is closed by [facility] Care Management, will reopen if needed before 08/01/2020 [facility] Case Management Team will follow member moving forward with assessments, care plans and documentation  Dudley Major RN, [facility], CDE Chronic Care Management Coordinator [facility] Care Management [phone]  Addendum: Client will be followed by the [facility] Case Management team beginning August 01, 2020. [facility] RNCM will continue to follow client through 07/31/20 Dudley Major RN, Maximiano Coss, CDE Chronic Care Management Coordinator [facility] Care Management [phone]

## 2020-06-11 DIAGNOSIS — Z6829 Body mass index (BMI) 29.0-29.9, adult: Secondary | ICD-10-CM | POA: Diagnosis not present

## 2020-06-11 DIAGNOSIS — Z1231 Encounter for screening mammogram for malignant neoplasm of breast: Secondary | ICD-10-CM | POA: Diagnosis not present

## 2020-06-11 DIAGNOSIS — Z01419 Encounter for gynecological examination (general) (routine) without abnormal findings: Secondary | ICD-10-CM | POA: Diagnosis not present

## 2020-08-06 ENCOUNTER — Other Ambulatory Visit: Payer: Self-pay

## 2020-08-27 DIAGNOSIS — L218 Other seborrheic dermatitis: Secondary | ICD-10-CM | POA: Diagnosis not present

## 2020-09-22 DIAGNOSIS — E1121 Type 2 diabetes mellitus with diabetic nephropathy: Secondary | ICD-10-CM | POA: Diagnosis not present

## 2020-09-22 DIAGNOSIS — J302 Other seasonal allergic rhinitis: Secondary | ICD-10-CM | POA: Diagnosis not present

## 2020-09-22 DIAGNOSIS — F17200 Nicotine dependence, unspecified, uncomplicated: Secondary | ICD-10-CM | POA: Diagnosis not present

## 2020-10-12 ENCOUNTER — Ambulatory Visit: Payer: PPO

## 2020-12-08 DIAGNOSIS — E785 Hyperlipidemia, unspecified: Secondary | ICD-10-CM | POA: Diagnosis not present

## 2020-12-08 DIAGNOSIS — R7309 Other abnormal glucose: Secondary | ICD-10-CM | POA: Diagnosis not present

## 2020-12-08 DIAGNOSIS — E039 Hypothyroidism, unspecified: Secondary | ICD-10-CM | POA: Diagnosis not present

## 2020-12-15 DIAGNOSIS — Z1212 Encounter for screening for malignant neoplasm of rectum: Secondary | ICD-10-CM | POA: Diagnosis not present

## 2020-12-15 DIAGNOSIS — R7989 Other specified abnormal findings of blood chemistry: Secondary | ICD-10-CM | POA: Diagnosis not present

## 2020-12-15 DIAGNOSIS — Z1389 Encounter for screening for other disorder: Secondary | ICD-10-CM | POA: Diagnosis not present

## 2020-12-15 DIAGNOSIS — R82998 Other abnormal findings in urine: Secondary | ICD-10-CM | POA: Diagnosis not present

## 2020-12-15 DIAGNOSIS — F17200 Nicotine dependence, unspecified, uncomplicated: Secondary | ICD-10-CM | POA: Diagnosis not present

## 2020-12-15 DIAGNOSIS — Z0000 Encounter for general adult medical examination without abnormal findings: Secondary | ICD-10-CM | POA: Diagnosis not present

## 2020-12-15 DIAGNOSIS — E785 Hyperlipidemia, unspecified: Secondary | ICD-10-CM | POA: Diagnosis not present

## 2020-12-15 DIAGNOSIS — I10 Essential (primary) hypertension: Secondary | ICD-10-CM | POA: Diagnosis not present

## 2020-12-15 DIAGNOSIS — D649 Anemia, unspecified: Secondary | ICD-10-CM | POA: Diagnosis not present

## 2020-12-15 DIAGNOSIS — J302 Other seasonal allergic rhinitis: Secondary | ICD-10-CM | POA: Diagnosis not present

## 2020-12-15 DIAGNOSIS — R634 Abnormal weight loss: Secondary | ICD-10-CM | POA: Diagnosis not present

## 2020-12-15 DIAGNOSIS — Z1331 Encounter for screening for depression: Secondary | ICD-10-CM | POA: Diagnosis not present

## 2020-12-15 DIAGNOSIS — E039 Hypothyroidism, unspecified: Secondary | ICD-10-CM | POA: Diagnosis not present

## 2020-12-15 DIAGNOSIS — E1121 Type 2 diabetes mellitus with diabetic nephropathy: Secondary | ICD-10-CM | POA: Diagnosis not present

## 2020-12-18 DIAGNOSIS — L02821 Furuncle of head [any part, except face]: Secondary | ICD-10-CM | POA: Diagnosis not present

## 2020-12-18 DIAGNOSIS — L280 Lichen simplex chronicus: Secondary | ICD-10-CM | POA: Diagnosis not present

## 2020-12-18 DIAGNOSIS — L818 Other specified disorders of pigmentation: Secondary | ICD-10-CM | POA: Diagnosis not present

## 2020-12-18 DIAGNOSIS — B9689 Other specified bacterial agents as the cause of diseases classified elsewhere: Secondary | ICD-10-CM | POA: Diagnosis not present

## 2020-12-18 DIAGNOSIS — L810 Postinflammatory hyperpigmentation: Secondary | ICD-10-CM | POA: Diagnosis not present

## 2020-12-29 DIAGNOSIS — E785 Hyperlipidemia, unspecified: Secondary | ICD-10-CM | POA: Diagnosis not present

## 2021-01-04 DIAGNOSIS — N951 Menopausal and female climacteric states: Secondary | ICD-10-CM | POA: Diagnosis not present

## 2021-02-26 DIAGNOSIS — E119 Type 2 diabetes mellitus without complications: Secondary | ICD-10-CM | POA: Diagnosis not present

## 2021-02-26 DIAGNOSIS — H40033 Anatomical narrow angle, bilateral: Secondary | ICD-10-CM | POA: Diagnosis not present

## 2021-03-24 DIAGNOSIS — E1121 Type 2 diabetes mellitus with diabetic nephropathy: Secondary | ICD-10-CM | POA: Diagnosis not present

## 2021-03-24 DIAGNOSIS — D649 Anemia, unspecified: Secondary | ICD-10-CM | POA: Diagnosis not present

## 2021-03-24 DIAGNOSIS — R232 Flushing: Secondary | ICD-10-CM | POA: Diagnosis not present

## 2021-03-24 DIAGNOSIS — I10 Essential (primary) hypertension: Secondary | ICD-10-CM | POA: Diagnosis not present

## 2021-03-29 DIAGNOSIS — R61 Generalized hyperhidrosis: Secondary | ICD-10-CM | POA: Diagnosis not present

## 2021-03-29 DIAGNOSIS — R232 Flushing: Secondary | ICD-10-CM | POA: Diagnosis not present

## 2021-04-13 ENCOUNTER — Emergency Department (HOSPITAL_COMMUNITY): Payer: HMO

## 2021-04-13 ENCOUNTER — Encounter (HOSPITAL_COMMUNITY): Payer: Self-pay

## 2021-04-13 ENCOUNTER — Other Ambulatory Visit: Payer: Self-pay

## 2021-04-13 ENCOUNTER — Emergency Department (HOSPITAL_COMMUNITY)
Admission: EM | Admit: 2021-04-13 | Discharge: 2021-04-13 | Disposition: A | Discharge status: Home / Self Care | Payer: HMO | Attending: Emergency Medicine | Admitting: Emergency Medicine

## 2021-04-13 DIAGNOSIS — Z79899 Other long term (current) drug therapy: Secondary | ICD-10-CM | POA: Diagnosis not present

## 2021-04-13 DIAGNOSIS — R519 Headache, unspecified: Secondary | ICD-10-CM | POA: Diagnosis not present

## 2021-04-13 DIAGNOSIS — I10 Essential (primary) hypertension: Secondary | ICD-10-CM | POA: Diagnosis not present

## 2021-04-13 DIAGNOSIS — R232 Flushing: Secondary | ICD-10-CM | POA: Diagnosis not present

## 2021-04-13 DIAGNOSIS — E119 Type 2 diabetes mellitus without complications: Secondary | ICD-10-CM | POA: Insufficient documentation

## 2021-04-13 DIAGNOSIS — R109 Unspecified abdominal pain: Secondary | ICD-10-CM | POA: Diagnosis not present

## 2021-04-13 DIAGNOSIS — R946 Abnormal results of thyroid function studies: Secondary | ICD-10-CM | POA: Diagnosis not present

## 2021-04-13 DIAGNOSIS — Z87891 Personal history of nicotine dependence: Secondary | ICD-10-CM | POA: Insufficient documentation

## 2021-04-13 DIAGNOSIS — Z7984 Long term (current) use of oral hypoglycemic drugs: Secondary | ICD-10-CM | POA: Diagnosis not present

## 2021-04-13 DIAGNOSIS — Z8673 Personal history of transient ischemic attack (TIA), and cerebral infarction without residual deficits: Secondary | ICD-10-CM

## 2021-04-13 DIAGNOSIS — I639 Cerebral infarction, unspecified: Secondary | ICD-10-CM | POA: Diagnosis not present

## 2021-04-13 DIAGNOSIS — R7989 Other specified abnormal findings of blood chemistry: Secondary | ICD-10-CM

## 2021-04-13 DIAGNOSIS — G459 Transient cerebral ischemic attack, unspecified: Secondary | ICD-10-CM | POA: Diagnosis not present

## 2021-04-13 DIAGNOSIS — Z7982 Long term (current) use of aspirin: Secondary | ICD-10-CM | POA: Diagnosis not present

## 2021-04-13 LAB — CBC WITH DIFFERENTIAL/PLATELET
Abs Immature Granulocytes: 0.03 10*3/uL (ref 0.00–0.07)
Basophils Absolute: 0.1 10*3/uL (ref 0.0–0.1)
Basophils Relative: 1 %
Eosinophils Absolute: 0.2 10*3/uL (ref 0.0–0.5)
Eosinophils Relative: 2 %
HCT: 48.2 % — ABNORMAL HIGH (ref 36.0–46.0)
Hemoglobin: 15.8 g/dL — ABNORMAL HIGH (ref 12.0–15.0)
Immature Granulocytes: 0 %
Lymphocytes Relative: 23 %
Lymphs Abs: 2.1 10*3/uL (ref 0.7–4.0)
MCH: 31.1 pg (ref 26.0–34.0)
MCHC: 32.8 g/dL (ref 30.0–36.0)
MCV: 94.9 fL (ref 80.0–100.0)
Monocytes Absolute: 0.6 10*3/uL (ref 0.1–1.0)
Monocytes Relative: 6 %
Neutro Abs: 5.9 10*3/uL (ref 1.7–7.7)
Neutrophils Relative %: 68 %
Platelets: 214 10*3/uL (ref 150–400)
RBC: 5.08 MIL/uL (ref 3.87–5.11)
RDW: 12.9 % (ref 11.5–15.5)
WBC: 8.8 10*3/uL (ref 4.0–10.5)
nRBC: 0 % (ref 0.0–0.2)

## 2021-04-13 LAB — COMPREHENSIVE METABOLIC PANEL
ALT: 20 U/L (ref 0–44)
AST: 18 U/L (ref 15–41)
Albumin: 4.1 g/dL (ref 3.5–5.0)
Alkaline Phosphatase: 70 U/L (ref 38–126)
Anion gap: 9 (ref 5–15)
BUN: 13 mg/dL (ref 8–23)
CO2: 27 mmol/L (ref 22–32)
Calcium: 9.6 mg/dL (ref 8.9–10.3)
Chloride: 104 mmol/L (ref 98–111)
Creatinine, Ser: 0.78 mg/dL (ref 0.44–1.00)
GFR, Estimated: >60 mL/min (ref >60)
Glucose, Bld: 93 mg/dL (ref 70–99)
Potassium: 4.1 mmol/L (ref 3.5–5.1)
Sodium: 140 mmol/L (ref 135–145)
Total Bilirubin: 0.6 mg/dL (ref 0.3–1.2)
Total Protein: 7.2 g/dL (ref 6.5–8.1)

## 2021-04-13 LAB — URINALYSIS, ROUTINE W REFLEX MICROSCOPIC
Bilirubin Urine: NEGATIVE
Glucose, UA: NEGATIVE mg/dL
Hgb urine dipstick: NEGATIVE
Ketones, ur: NEGATIVE mg/dL
Leukocytes,Ua: NEGATIVE
Nitrite: NEGATIVE
Protein, ur: NEGATIVE mg/dL
Specific Gravity, Urine: <1.005 — ABNORMAL LOW (ref 1.005–1.030)
pH: 6 (ref 5.0–8.0)

## 2021-04-13 LAB — TSH: TSH: 0.078 u[IU]/mL — ABNORMAL LOW (ref 0.350–4.500)

## 2021-04-13 LAB — C-REACTIVE PROTEIN: CRP: <0.5 mg/dL (ref <1.0)

## 2021-04-13 LAB — LIPASE, BLOOD: Lipase: 29 U/L (ref 11–51)

## 2021-04-13 LAB — SEDIMENTATION RATE: Sed Rate: 5 mm/hr (ref 0–22)

## 2021-04-13 MED ORDER — IOHEXOL 350 MG/ML SOLN
75.0000 mL | Freq: Once | INTRAVENOUS | Status: AC | PRN
  Administered 2021-04-13: 75 mL via INTRAVENOUS

## 2021-04-13 NOTE — ED Triage Notes (Signed)
Pt arrived via POV, c/o headache and abd cramping x1 month. States she has seen OBGYN and PCP for same, has told issue with thyroid, pt does not believe medication has helped.

## 2021-04-13 NOTE — ED Provider Notes (Signed)
[facility]-EMERGENCY DEPT Provider Note   CSN: [id number] Arrival date & time: 04/13/21  1217     History Chief Complaint  Patient presents with   Headache    Tabitha Weaver is a 75 y.o. female.  HPI     75 year old female with history of hypertension, hyperlipidemia and thyroid disease comes in with chief complaint of headaches and hot flashes.  She also has history of diabetes.  She reports that for the last month or month and a half she has been having daily overnight hot flashes with associated headaches.  The symptoms will last for several minutes and then resolved.  She is unable to go back to bed afterwards.  She has seen her gynecologist and her PCP who had ordered some labs and tweaked her thyroid medications.  However, her symptoms have persisted.  The headaches are frontal, throbbing and moderately severe when present.  She is unsure if the symptoms are worse with any position but does think that the light makes her headache worse.  There is no associated new dizziness, vision change, slurred speech, confusion, balance issues or weakness.  No history of similar symptoms in the past.  Patient has not required to take any medications for her headaches.  She denies any new medications.  No history of strokes.  Past Medical History:  Diagnosis Date   High cholesterol    Hypertension    Osteoporosis    Thyroid disease     Patient Active Problem List   Diagnosis Date Noted   Diabetes mellitus (HCC) 05/21/2019   Hyperlipidemia 05/21/2019   Hypertensive disorder 05/21/2019    Past Surgical History:  Procedure Laterality Date   ABDOMINAL HYSTERECTOMY  1985   ROTATOR CUFF REPAIR Right 09/2013, 1995     OB History   No obstetric history on file.     Family History  Problem Relation Age of Onset   Colon cancer Neg Hx    Esophageal cancer Neg Hx    Rectal cancer Neg Hx    Stomach cancer Neg Hx     Social History   Tobacco Use   Smoking  status: Former    Packs/day: 0.25    Types: Cigarettes   Smokeless tobacco: Never  Substance Use Topics   Alcohol use: Yes    Alcohol/week: 4.0 standard drinks    Types: 4 Glasses of wine per week   Drug use: No    Home Medications Prior to Admission medications   Medication Sig Start Date End Date Taking? Authorizing Provider  aspirin EC 81 MG tablet Take 81 mg by mouth daily.    [provider]  atorvastatin (LIPITOR) 20 MG tablet Take 20 mg by mouth daily.    [provider]  Ergocalciferol (VITAMIN D2 PO) Take by mouth.    [provider]  gabapentin (NEURONTIN) 300 MG capsule Take 1 capsule (300 mg total) by mouth 3 (three) times daily. Patient not taking: Reported on 03/06/2019 05/26/16   Lenn Sink, DPM  levothyroxine (SYNTHROID, LEVOTHROID) 112 MCG tablet Take 112 mcg by mouth daily before breakfast.    [provider]  losartan (COZAAR) 100 MG tablet Take 100 mg by mouth daily.    [provider]  metFORMIN (GLUCOPHAGE) 500 MG tablet Take by mouth 2 (two) times daily with a meal.    [provider]  VITAMIN E PO Take by mouth daily.    [provider]    Allergies  Patient has no known allergies.  Review of Systems   Review of Systems  Constitutional:  Positive for activity change.  Eyes:  Positive for photophobia.  Respiratory:  Negative for shortness of breath.   Cardiovascular:  Negative for chest pain.  Gastrointestinal:  Negative for nausea and vomiting.  Neurological:  Positive for dizziness and headaches. Negative for syncope, speech difficulty and weakness.  All other systems reviewed and are negative.  Physical Exam Updated Vital Signs BP 136/78 (BP Location: Right Arm)   Pulse 76   Temp 98 F (36.7 C) (Oral)   Resp 18   SpO2 100%   Physical Exam Vitals and nursing note reviewed.  Constitutional:      Appearance: She is well-developed.  HENT:     Head: Atraumatic.  Eyes:      General: No visual field deficit. Cardiovascular:     Rate and Rhythm: Normal rate.  Pulmonary:     Effort: Pulmonary effort is normal.  Musculoskeletal:     Cervical back: Normal range of motion and neck supple.  Skin:    General: Skin is warm and dry.  Neurological:     Mental Status: She is alert and oriented to person, place, and time.     GCS: GCS eye subscore is 4. GCS verbal subscore is 5. GCS motor subscore is 6.     Cranial Nerves: No cranial nerve deficit, dysarthria or facial asymmetry.     Sensory: No sensory deficit.     Motor: No weakness.     Coordination: Romberg sign negative.    ED Results / Procedures / Treatments   Labs (all labs ordered are listed, but only abnormal results are displayed) Labs Reviewed  CBC WITH DIFFERENTIAL/PLATELET - Abnormal; Notable for the following components:      Result Value   Hemoglobin 15.8 (*)    HCT 48.2 (*)    All other components within normal limits  TSH - Abnormal; Notable for the following components:   TSH 0.078 (*)    All other components within normal limits  URINALYSIS, ROUTINE W REFLEX MICROSCOPIC - Abnormal; Notable for the following components:   Specific Gravity, Urine <1.005 (*)    All other components within normal limits  COMPREHENSIVE METABOLIC PANEL  LIPASE, BLOOD  SEDIMENTATION RATE  C-REACTIVE PROTEIN    EKG None  Radiology CT Angio Head W/Cm &/Or Wo Cm  Result Date: 04/13/2021 CLINICAL DATA:  Stroke/TIA, assess arteries EXAM: CT ANGIOGRAPHY HEAD AND NECK TECHNIQUE: Multidetector CT imaging of the head and neck was performed using the standard protocol during bolus administration of intravenous contrast. Multiplanar CT image reconstructions and MIPs were obtained to evaluate the vascular anatomy. Carotid stenosis measurements (when applicable) are obtained utilizing NASCET criteria, using the distal internal carotid diameter as the denominator. CONTRAST:  75mL OMNIPAQUE IOHEXOL 350 MG/ML SOLN  COMPARISON:  No prior CTA, correlation is made with CT head 04/13/2021 FINDINGS: CT HEAD FINDINGS Brain: Right frontal encephalomalacia. No acute infarct, hemorrhage, mass, mass effect, midline shift. Ventricles are normal for age, with ex vacuo dilatation of the right frontal horn. Skull: Normal. Negative for fracture or focal lesion. Sinuses: Mucous retention cyst in the right maxillary sinus. Orbits: No acute finding. Review of the MIP images confirms the above findings CTA NECK FINDINGS Aortic arch: Standard branching. Imaged portion shows no evidence of aneurysm or dissection. No significant stenosis of the major arch vessel origins. Right carotid system: No evidence of dissection, stenosis (50% or greater), or occlusion. Left  carotid system: No evidence of dissection, stenosis (50% or greater), or occlusion. Vertebral arteries: Diminutive right vertebral artery, which terminates in PICA. Normal left vertebral artery. No evidence of dissection, stenosis (50% or greater) or occlusion. Skeleton: Straightening and mild reversal of the normal cervical lordosis. No acute osseous abnormality. Other neck: Mildly dilated ducts in the submandibular glands, which are otherwise unremarkable. No stone. Upper chest: Linear scarring along the posterior left upper lobe. No focal pulmonary opacity. Review of the MIP images confirms the above findings CTA HEAD FINDINGS Anterior circulation: Both internal carotid arteries are widely patent to the termini, without stenosis or other abnormality. Scattered carotid siphon calcifications. A1 segments patent. Normal anterior communicating artery. Anterior cerebral arteries are widely patent to their distal aspects. No M1 stenosis or occlusion. Normal MCA bifurcations. Distal MCA branches well perfused and symmetric. Posterior circulation: Vertebral arteries are patent. The right vertebral artery terminates in PICA. The basilar artery is diminutive but patent. Posterior inferior  cerebral arteries patent bilaterally. Basilar patent to its distal aspect. Superior cerebral arteries patent bilaterally. PCAs well perfused to their distal aspects without stenosis, primarily from prominent bilateral posterior communicating arteries. Venous sinuses: As permitted by contrast timing, patent. Anatomic variants: None significant Review of the MIP images confirms the above findings IMPRESSION: 1. No large vessel occlusion. 2. No hemodynamically significant stenosis in the neck. Electronically Signed   By: Wiliam Ke M.D.   On: 04/13/2021 18:28   CT Head Wo Contrast  Result Date: 04/13/2021 CLINICAL DATA:  Headache and blurry vision. EXAM: CT HEAD WITHOUT CONTRAST TECHNIQUE: Contiguous axial images were obtained from the base of the skull through the vertex without intravenous contrast. COMPARISON:  None. FINDINGS: Brain: Area encephalomalacia in the right frontoparietal region consistent with an old infarct. Mild ex vacuo dilatation of the frontal horn of the right lateral ventricle. The ventricles are in the midline without mass effect or shift. No extra-axial fluid collections are identified. No CT findings for acute hemispheric infarction or intracranial hemorrhage. No mass lesions are identified. The brainstem and cerebellum are grossly normal. Vascular: Scattered vascular calcifications but no aneurysm or hyperdense vessels. Skull: No skull fracture or bone lesions. Sinuses/Orbits: The paranasal sinuses and mastoid air cells are clear except for a small mucous retention cyst or polyp in the right maxillary sinus. The globes are intact. Other: No scalp lesions or scalp hematoma. IMPRESSION: 1. Remote right frontoparietal infarct. 2. No acute intracranial findings or mass lesions. Electronically Signed   By: Rudie Meyer M.D.   On: 04/13/2021 14:25   CT Angio Neck W and/or Wo Contrast  Result Date: 04/13/2021 CLINICAL DATA:  Stroke/TIA, assess arteries EXAM: CT ANGIOGRAPHY HEAD AND NECK  TECHNIQUE: Multidetector CT imaging of the head and neck was performed using the standard protocol during bolus administration of intravenous contrast. Multiplanar CT image reconstructions and MIPs were obtained to evaluate the vascular anatomy. Carotid stenosis measurements (when applicable) are obtained utilizing NASCET criteria, using the distal internal carotid diameter as the denominator. CONTRAST:  23mL OMNIPAQUE IOHEXOL 350 MG/ML SOLN COMPARISON:  No prior CTA, correlation is made with CT head 04/13/2021 FINDINGS: CT HEAD FINDINGS Brain: Right frontal encephalomalacia. No acute infarct, hemorrhage, mass, mass effect, midline shift. Ventricles are normal for age, with ex vacuo dilatation of the right frontal horn. Skull: Normal. Negative for fracture or focal lesion. Sinuses: Mucous retention cyst in the right maxillary sinus. Orbits: No acute finding. Review of the MIP images confirms the above findings CTA NECK FINDINGS Aortic  arch: Standard branching. Imaged portion shows no evidence of aneurysm or dissection. No significant stenosis of the major arch vessel origins. Right carotid system: No evidence of dissection, stenosis (50% or greater), or occlusion. Left carotid system: No evidence of dissection, stenosis (50% or greater), or occlusion. Vertebral arteries: Diminutive right vertebral artery, which terminates in PICA. Normal left vertebral artery. No evidence of dissection, stenosis (50% or greater) or occlusion. Skeleton: Straightening and mild reversal of the normal cervical lordosis. No acute osseous abnormality. Other neck: Mildly dilated ducts in the submandibular glands, which are otherwise unremarkable. No stone. Upper chest: Linear scarring along the posterior left upper lobe. No focal pulmonary opacity. Review of the MIP images confirms the above findings CTA HEAD FINDINGS Anterior circulation: Both internal carotid arteries are widely patent to the termini, without stenosis or other  abnormality. Scattered carotid siphon calcifications. A1 segments patent. Normal anterior communicating artery. Anterior cerebral arteries are widely patent to their distal aspects. No M1 stenosis or occlusion. Normal MCA bifurcations. Distal MCA branches well perfused and symmetric. Posterior circulation: Vertebral arteries are patent. The right vertebral artery terminates in PICA. The basilar artery is diminutive but patent. Posterior inferior cerebral arteries patent bilaterally. Basilar patent to its distal aspect. Superior cerebral arteries patent bilaterally. PCAs well perfused to their distal aspects without stenosis, primarily from prominent bilateral posterior communicating arteries. Venous sinuses: As permitted by contrast timing, patent. Anatomic variants: None significant Review of the MIP images confirms the above findings IMPRESSION: 1. No large vessel occlusion. 2. No hemodynamically significant stenosis in the neck. Electronically Signed   By: Wiliam Ke M.D.   On: 04/13/2021 18:28    Procedures Procedures   Medications Ordered in ED Medications  iohexol (OMNIPAQUE) 350 MG/ML injection 75 mL (75 mLs Intravenous Contrast Given 04/13/21 1752)    ED Course  I have reviewed the triage vital signs and the nursing notes.  Pertinent labs & imaging results that were available during my care of the patient were reviewed by me and considered in my medical decision making (see chart for details).    MDM Rules/Calculators/A&P                            75 year old female comes in with chief complaint of headaches.  Headaches have been present now for more than a month.  She also has some intermittent episodes of abdominal cramping, night sweats associated with the headaches.  No focal neurodeficits besides some photophobia and questionable visual disturbance during the event.  CT scan of the brain was ordered which did show remote infarct.  Patient has no neurodeficits on  exam.  Given patient's age, differential diagnosis would include tumor, chronic subdural bleed, subarachnoid bleed.  Other possibilities considered include temporal arteritis, venous thrombosis.  Given the pain is intermittent, she does not have any pain at this time -I do not think any further work-up for the headache is needed.  Given her remote infarct on the CT scan however, we will get CT angiogram head and neck.  Patient is frustrated with the fact that her PCP and gynecologist are not addressing her headaches.  I informed her that it is best to continue follow-up with them, and that they might have a plan that they just might not have communicated to her clearly.  She however does want to see a neurologist if possible, I will give her referral for them.  Remote infarct on CT scan discussed with  her. She will continue take aspirin  Strict ER return precautions have been discussed, and patient is agreeing with the plan and is comfortable with the workup done and the recommendations from the ER.   Final Clinical Impression(s) / ED Diagnoses Final diagnoses:  Intermittent headache  Hot flash not due to menopause  Low TSH level  Cerebral infarction, remote, resolved    Rx / DC Orders ED Discharge Orders     None        Derwood Kaplan, MD 04/13/21 1853

## 2021-04-13 NOTE — ED Provider Notes (Signed)
Emergency Medicine Provider Triage Evaluation Note  Tabitha Weaver , a 74 y.o. female  was evaluated in triage.  Pt complains of "everything is wrong" when asked why she is here.  States she been having headaches with blurry vision and double vision intermittently for the last months without seeing an eye doctor.  Additionally has lower abdominal cramping and vaginal discharge, history of hysterectomy in the past.  Has been seen by her OB/GYN and started on venlafaxine for hot flashes..  Review of Systems  Positive: Headache, blurry and double vision, abdominal pain, vaginal discharge Negative: Dysuria, unilateral weakness, falls  Physical Exam  BP (!) 145/89 (BP Location: Left Arm)   Pulse 88   Temp 98 F (36.7 C) (Oral)   Resp 18   SpO2 100%  Gen:   Awake, no distress   Resp:  Normal effort  MSK:   Moves extremities without difficulty  Other:  EOMI, no focal deficit on neuro exam.  Abdomen soft, nondistended, nontender.  Medical Decision Making  Medically screening exam initiated at 12:44 PM.  Appropriate orders placed.  Barnetta Hammersmith was informed that the remainder of the evaluation will be completed by another provider, this initial triage assessment does not replace that evaluation, and the importance of remaining in the ED until their evaluation is complete.  This chart was dictated using voice recognition software, Dragon. Despite the best efforts of this provider to proofread and correct errors, errors may still occur which can change documentation meaning.    Paris Lore, PA-C 04/13/21 1245    Melene Plan, DO 04/13/21 1524

## 2021-04-13 NOTE — Discharge Instructions (Addendum)
We saw in the ER for headaches and hot flashes.  It is unclear why you are having the symptoms. The CT scan of the brain did reveal an old stroke.  The scan did not reveal any blockages in your blood vessels, aneurysms or tumors.  We would like you to follow-up with your primary care doctor for the hot flashes that you are suffering. We have also given you referral information for neurologist who you can see for your off-and-on headaches.  Please return to the ER if the headache gets severe and in not improving, you have associated new one sided numbness, tingling, weakness or confusion, seizures, poor balance or poor vision.  Make sure you continue to take your baby aspirin. Return to the ER immediately if you start having sudden onset of slurred speech, change in vision, change in balance, new one-sided numbness or weakness.

## 2021-04-14 ENCOUNTER — Encounter: Payer: Self-pay | Admitting: Neurology

## 2021-04-26 DIAGNOSIS — R102 Pelvic and perineal pain: Secondary | ICD-10-CM | POA: Diagnosis not present

## 2021-04-26 DIAGNOSIS — N898 Other specified noninflammatory disorders of vagina: Secondary | ICD-10-CM | POA: Diagnosis not present

## 2021-05-14 NOTE — Progress Notes (Signed)
NEUROLOGY CONSULTATION NOTE  Tabitha Weaver MRN: [id number] DOB: 07/27/1946  Referring provider: Alysia Penna, MD Primary care provider: Alysia Penna, MD  Reason for consult:  headache and dizziness  Assessment/Plan:   Probable migraine, triggered by underlying hot flashes.  Secondary etiologies negative such as intracranial mass lesion, cerebral aneurysm, arterial dissection/stenosis and temporal arteritis.  She is already on venlafaxine.  Recommended increasing dose to 75mg  daily to help further reduce headache intensity, frequency and duration (addition of this medication may have contributed to improvement already).  She would like to run it by her prescribing provider first. Limit use of pain relievers to no more than 2 days out of week to prevent risk of rebound or medication-overuse headache. Follow up 6 months.   Subjective:  Tabitha Weaver is a 75 year old female with HTN and thyroid disease who presents for headache and dizziness.  History supplemented by ED note.  CT head and CTA head and neck personally reviewed  In April-May, she began having hot flashes and headache every night.  She wakes up in the middle of the night with the headaches and hot flashes.  Headaches are severe right sided throbbing headache with right eye pain and blurred vision.  Initially had associated nausea and cold sweats but that subsided.  No photophobia or phonophobia, numbness or weakness..  They used to last a while but now less than an hour.  They used to be daily but now occur every other night.  She followed up with her gynecologist and PCP who adjusted her thyroid medication, but symptoms continued.  She was seen in the ED on 04/13/2021.  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  CTA of head and neck showed no aneurysm, dissection or significant stenosis.  Sed rate was 5 and CRP was < 0.5.  TSH was 0.078.  Reports remote history of migraines but  this is different.   Current medications:  venlafaxine ZR 37.5mg  daily Past medications:  gabapentin, paroxetine  PAST MEDICAL HISTORY: Past Medical History:  Diagnosis Date   High cholesterol    Hypertension    Osteoporosis    Thyroid disease     PAST SURGICAL HISTORY: Past Surgical History:  Procedure Laterality Date   ABDOMINAL HYSTERECTOMY  1985   ROTATOR CUFF REPAIR Right 09/2013, 1995    MEDICATIONS: Current Outpatient Medications on File Prior to Visit  Medication Sig Dispense Refill   aspirin EC 81 MG tablet Take 81 mg by mouth daily.     atorvastatin (LIPITOR) 20 MG tablet Take 20 mg by mouth daily.     Ergocalciferol (VITAMIN D2 PO) Take by mouth.     gabapentin (NEURONTIN) 300 MG capsule Take 1 capsule (300 mg total) by mouth 3 (three) times daily. (Patient not taking: Reported on 03/06/2019) 90 capsule 3   levothyroxine (SYNTHROID, LEVOTHROID) 112 MCG tablet Take 112 mcg by mouth daily before breakfast.     losartan (COZAAR) 100 MG tablet Take 100 mg by mouth daily.     metFORMIN (GLUCOPHAGE) 500 MG tablet Take by mouth 2 (two) times daily with a meal.     VITAMIN E PO Take by mouth daily.     No current facility-administered medications on file prior to visit.    ALLERGIES: No Known Allergies  FAMILY HISTORY: Family History  Problem Relation Age of Onset   Colon cancer Neg Hx    Esophageal cancer Neg Hx    Rectal cancer Neg Hx  Stomach cancer Neg Hx     Objective:  Blood pressure 137/83, pulse 84, height 5\' 6"  (1.676 m), weight 169 lb 3.2 oz (76.7 kg), SpO2 100 %. General: No acute distress.  Patient appears well-groomed.   Head:  Normocephalic/atraumatic Eyes:  fundi examined but not visualized Neck: supple, no paraspinal tenderness, full range of motion Back: No paraspinal tenderness Heart: regular rate and rhythm Lungs: Clear to auscultation bilaterally. Vascular: No carotid bruits. Neurological Exam: Mental status: alert and oriented to  person, place, and time, recent and remote memory intact, fund of knowledge intact, attention and concentration intact, speech fluent and not dysarthric, language intact. Cranial nerves: CN I: not tested CN II: pupils equal, round and reactive to light, visual fields intact CN III, IV, VI:  full range of motion, no nystagmus, no ptosis CN V: facial sensation intact. CN VII: upper and lower face symmetric CN VIII: hearing intact CN IX, X: gag intact, uvula midline CN XI: sternocleidomastoid and trapezius muscles intact CN XII: tongue midline Bulk & Tone: normal, no fasciculations. Motor:  muscle strength 5/5 throughout Sensation:  Pinprick, temperature and vibratory sensation intact. Deep Tendon Reflexes:  2+ throughout,  toes downgoing.   Finger to nose testing:  Without dysmetria.   Heel to shin:  Without dysmetria.   Gait:  Normal station and stride.  Romberg negative.    Thank you for allowing me to take part in the care of this patient.  , DO  CC: Shon Millet, MD

## 2021-05-17 ENCOUNTER — Other Ambulatory Visit: Payer: Self-pay

## 2021-05-17 ENCOUNTER — Ambulatory Visit: Payer: HMO | Admitting: Neurology

## 2021-05-17 ENCOUNTER — Encounter: Payer: Self-pay | Admitting: Neurology

## 2021-05-17 VITALS — BP 137/83 | HR 84 | Ht 66.0 in | Wt 169.2 lb

## 2021-05-17 DIAGNOSIS — G43009 Migraine without aura, not intractable, without status migrainosus: Secondary | ICD-10-CM | POA: Diagnosis not present

## 2021-05-17 NOTE — Patient Instructions (Signed)
Ask about increasing venlafaxine XR to 75mg  daily Limit use of pain relievers to no more than 2 days out of week to prevent risk of rebound or medication-overuse headache. Follow up in 6 months.

## 2021-05-18 DIAGNOSIS — R350 Frequency of micturition: Secondary | ICD-10-CM | POA: Diagnosis not present

## 2021-05-18 DIAGNOSIS — N760 Acute vaginitis: Secondary | ICD-10-CM | POA: Diagnosis not present

## 2021-06-03 ENCOUNTER — Encounter: Payer: Self-pay | Admitting: Endocrinology

## 2021-06-03 ENCOUNTER — Ambulatory Visit (INDEPENDENT_AMBULATORY_CARE_PROVIDER_SITE_OTHER): Payer: HMO | Admitting: Endocrinology

## 2021-06-03 ENCOUNTER — Other Ambulatory Visit: Payer: Self-pay

## 2021-06-03 DIAGNOSIS — E039 Hypothyroidism, unspecified: Secondary | ICD-10-CM | POA: Diagnosis not present

## 2021-06-03 MED ORDER — LEVOTHYROXINE SODIUM 88 MCG PO TABS: 88.0000 ug | ORAL_TABLET | Freq: Every day | ORAL | 3 refills | Status: DC

## 2021-06-03 NOTE — Progress Notes (Signed)
Subjective:    Patient ID: Tabitha Weaver, female    DOB: [birth year], 75 y.o.   MRN: [id number]  HPI Pt is referred by Dr Link Snuffer, for hypothyroidism.  Pt reports hypothyroidism was dx'ed in 2010.  she has been synthroid since dx.  she has never taken kelp or any other type of non-prescribed thyroid product.  she has never had thyroid imaging.  She has never had thyroid surgery, or XRT to the neck.  she has never been on amiodarone or lithium.  Pt says synthroid was reduced to 88 mcg/d, approx 3 weeks ago.  She reports heat intolerance and sweating. Past Medical History:  Diagnosis Date   High cholesterol    Hypertension    Osteoporosis    Thyroid disease     Past Surgical History:  Procedure Laterality Date   ABDOMINAL HYSTERECTOMY  1985   ROTATOR CUFF REPAIR Right 09/2013, 1995    Social History   Socioeconomic History   Marital status: Single    Spouse name: Not on file   Number of children: Not on file   Years of education: Not on file   Highest education level: Not on file  Occupational History   Not on file  Tobacco Use   Smoking status: Former    Packs/day: 0.25    Types: Cigarettes   Smokeless tobacco: Never  Substance and Sexual Activity   Alcohol use: Yes    Alcohol/week: 4.0 standard drinks    Types: 4 Glasses of wine per week   Drug use: No   Sexual activity: Not on file  Other Topics Concern   Not on file  Social History Narrative   Not on file   Social Determinants of Health   Financial Resource Strain: Not on file  Food Insecurity: Not on file  Transportation Needs: Not on file  Physical Activity: Not on file  Stress: Not on file  Social Connections: Not on file  Intimate Partner Violence: Not on file    Current Outpatient Medications on File Prior to Visit  Medication Sig Dispense Refill   aspirin EC 81 MG tablet Take 81 mg by mouth daily.     atorvastatin (LIPITOR) 20 MG tablet Take 20 mg by mouth daily.     Ergocalciferol (VITAMIN D2 PO)  Take by mouth.     gabapentin (NEURONTIN) 300 MG capsule Take 1 capsule (300 mg total) by mouth 3 (three) times daily. 90 capsule 3   Lancets (ONETOUCH DELICA PLUS LANCET30G) MISC SMARTSIG:Via Meter     losartan (COZAAR) 100 MG tablet Take 100 mg by mouth daily.     metFORMIN (GLUCOPHAGE) 500 MG tablet Take by mouth 2 (two) times daily with a meal.     PARoxetine (PAXIL) 10 MG tablet Take 10 mg by mouth every morning.     venlafaxine XR (EFFEXOR-XR) 37.5 MG 24 hr capsule Take 37.5 mg by mouth daily.     VITAMIN E PO Take by mouth daily.     No current facility-administered medications on file prior to visit.    No Known Allergies  Family History  Problem Relation Age of Onset   Thyroid disease Daughter    Colon cancer Neg Hx    Esophageal cancer Neg Hx    Rectal cancer Neg Hx    Stomach cancer Neg Hx     BP (!) 160/84 (BP Location: Right Arm, Patient Position: Sitting, Cuff Size: Normal)   Pulse 69   Ht 5\' 6"  (1.676 m)  Wt 169 lb 6.4 oz (76.8 kg)   SpO2 99%   BMI 27.34 kg/m    Review of Systems denies palpitations, sob, tremor, anxiety.   She has lost 30 lbs x 6 mos.      Objective:   Physical Exam VS: see vs page GEN: no distress HEAD: head: no deformity eyes: no periorbital swelling, no proptosis external nose and ears are normal NECK: supple, thyroid is not enlarged CHEST WALL: no deformity LUNGS: clear to auscultation CV: reg rate and rhythm, no murmur.  MUSCULOSKELETAL: gait is normal and steady EXTEMITIES: no deformity.  no leg edema.   NEURO:  readily moves all 4's.  sensation is intact to touch on all 4's.  No tremor.  SKIN:  Normal texture and temperature.  No rash or suspicious lesion is visible.  Not diaphoretic.   NODES:  None palpable at the neck.   PSYCH: alert, well-oriented.  Does not appear anxious nor depressed.     Lab Results  Component Value Date   TSH 0.078 (L) 04/13/2021   I have reviewed outside records, and summarized: Pt was noted  to have abnormal TFT, and referred here.  Dr Link Snuffer decreased synthroid 9/22.      Assessment & Plan:  Hypothyroidism, overcontrolled. Despite pt saying synthroid was not reduced until 05/24/21, Dr Link Snuffer says it was sooner.  As pt insists, we'll wait a few weeks until recheck.    Patient Instructions  Please continue the same levothyroxine.   Please recheck the blood tests in 3-4 weeks, at any [place] office.  Please come back for a follow-up appointment in 3 months.

## 2021-06-03 NOTE — Patient Instructions (Addendum)
Please continue the same levothyroxine.   Please recheck the blood tests in 3-4 weeks, at any [place] office.  Please come back for a follow-up appointment in 3 months.

## 2021-06-14 DIAGNOSIS — Z124 Encounter for screening for malignant neoplasm of cervix: Secondary | ICD-10-CM | POA: Diagnosis not present

## 2021-06-14 DIAGNOSIS — N958 Other specified menopausal and perimenopausal disorders: Secondary | ICD-10-CM | POA: Diagnosis not present

## 2021-06-14 DIAGNOSIS — M8588 Other specified disorders of bone density and structure, other site: Secondary | ICD-10-CM | POA: Diagnosis not present

## 2021-06-14 DIAGNOSIS — E039 Hypothyroidism, unspecified: Secondary | ICD-10-CM | POA: Diagnosis not present

## 2021-06-14 DIAGNOSIS — Z1231 Encounter for screening mammogram for malignant neoplasm of breast: Secondary | ICD-10-CM | POA: Diagnosis not present

## 2021-06-14 DIAGNOSIS — Z6827 Body mass index (BMI) 27.0-27.9, adult: Secondary | ICD-10-CM | POA: Diagnosis not present

## 2021-06-30 DIAGNOSIS — Z779 Other contact with and (suspected) exposures hazardous to health: Secondary | ICD-10-CM | POA: Diagnosis not present

## 2021-07-01 ENCOUNTER — Other Ambulatory Visit: Payer: Self-pay

## 2021-07-01 ENCOUNTER — Other Ambulatory Visit: Payer: Self-pay | Admitting: Endocrinology

## 2021-07-01 ENCOUNTER — Other Ambulatory Visit (INDEPENDENT_AMBULATORY_CARE_PROVIDER_SITE_OTHER): Payer: HMO

## 2021-07-01 DIAGNOSIS — E039 Hypothyroidism, unspecified: Secondary | ICD-10-CM

## 2021-07-01 LAB — TSH: TSH: 6.3 u[IU]/mL — ABNORMAL HIGH (ref 0.35–5.50)

## 2021-07-01 LAB — T4, FREE: Free T4: 0.53 ng/dL — ABNORMAL LOW (ref 0.60–1.60)

## 2021-07-01 MED ORDER — LEVOTHYROXINE SODIUM 100 MCG PO TABS: 100.0000 ug | ORAL_TABLET | Freq: Every day | ORAL | 3 refills | Status: DC

## 2021-07-12 ENCOUNTER — Ambulatory Visit: Payer: HMO | Admitting: Neurology

## 2021-08-05 ENCOUNTER — Other Ambulatory Visit: Payer: Self-pay

## 2021-08-05 ENCOUNTER — Ambulatory Visit
Admission: EM | Admit: 2021-08-05 | Discharge: 2021-08-05 | Disposition: A | Discharge status: Home / Self Care | Payer: HMO | Attending: Internal Medicine | Admitting: Internal Medicine

## 2021-08-05 ENCOUNTER — Encounter: Payer: Self-pay | Admitting: Emergency Medicine

## 2021-08-05 DIAGNOSIS — I10 Essential (primary) hypertension: Secondary | ICD-10-CM | POA: Diagnosis not present

## 2021-08-05 DIAGNOSIS — R051 Acute cough: Secondary | ICD-10-CM

## 2021-08-05 MED ORDER — AMLODIPINE BESYLATE 2.5 MG PO TABS: 2.5000 mg | ORAL_TABLET | Freq: Every day | ORAL | 2 refills | Status: AC

## 2021-08-05 NOTE — ED Triage Notes (Signed)
Dentist told her her BP was high. Denies headache, numbness, tingling, dizziness  Also c/o cough starting yesterday, reports hx of bronchitis in the winter.

## 2021-08-05 NOTE — ED Provider Notes (Signed)
[facility]    CSN: [id number] Arrival date & time: 08/05/21  1009      History   Chief Complaint Chief Complaint  Patient presents with   Hypertension   Cough    HPI Tabitha Weaver is a 76 y.o. female.   She reports to the urgent care today due to concerns for high blood pressure.  Patient reports that she went to her dentist yesterday and they told her that her blood pressure was high.  She is not sure what the number of her blood pressure was in her dentist yesterday as they did not tell this to her.  She went to her PCP yesterday and told them about her blood pressure but reports that she was not seen by healthcare provider.  Her next PCP appointment is in May.  She does not take her blood pressure at home.  Denies headache, chest pain, shortness of breath, blurred vision, dizziness, nausea, vomiting.  She currently takes losartan for blood pressure and has been taking for multiple years and tolerating well.  She also has a cough that started yesterday.  Cough is nonproductive per patient.  Denies any other upper respiratory symptoms, shortness of breath, fever.  Denies any known sick contacts.  Patient has not taken any medications to help alleviate cough.   Hypertension  Cough  Past Medical History:  Diagnosis Date   High cholesterol    Hypertension    Osteoporosis    Thyroid disease     Patient Active Problem List   Diagnosis Date Noted   Hypothyroidism 06/03/2021   Diabetes mellitus ([place]) 05/21/2019   Hyperlipidemia 05/21/2019   Hypertensive disorder 05/21/2019    Past Surgical History:  Procedure Laterality Date   ABDOMINAL HYSTERECTOMY  1985   ROTATOR CUFF REPAIR Right 09/2013, 1995    OB History   No obstetric history on file.      Home Medications    Prior to Admission medications   Medication Sig Start Date End Date Taking? Authorizing Provider  amLODipine (NORVASC) 2.5 MG tablet Take 1 tablet (2.5 mg total) by mouth daily. 08/05/21   Yes Oswaldo Conroy E, FNP  aspirin EC 81 MG tablet Take 81 mg by mouth daily.    [provider]  atorvastatin (LIPITOR) 20 MG tablet Take 20 mg by mouth daily.    [provider]  Ergocalciferol (VITAMIN D2 PO) Take by mouth.    [provider]  gabapentin (NEURONTIN) 300 MG capsule Take 1 capsule (300 mg total) by mouth 3 (three) times daily. 05/26/16   Wallene Huh, DPM  Lancets (ONETOUCH DELICA PLUS Q000111Q) [place] SMARTSIG:Via Meter 03/24/21   [provider]  levothyroxine (SYNTHROID) 100 MCG tablet Take 1 tablet (100 mcg total) by mouth daily. 07/01/21   Renato Shin, MD  losartan (COZAAR) 100 MG tablet Take 100 mg by mouth daily.    [provider]  metFORMIN (GLUCOPHAGE) 500 MG tablet Take by mouth 2 (two) times daily with a meal.    [provider]  PARoxetine (PAXIL) 10 MG tablet Take 10 mg by mouth every morning. 03/24/21   [provider]  venlafaxine XR (EFFEXOR-XR) 37.5 MG 24 hr capsule Take 37.5 mg by mouth daily. 04/21/21   [provider]  VITAMIN E PO Take by mouth daily.    [provider]    Family History Family History  Problem Relation Age of Onset   Thyroid disease Daughter    Colon cancer Neg  Hx    Esophageal cancer Neg Hx    Rectal cancer Neg Hx    Stomach cancer Neg Hx     Social History Social History   Tobacco Use   Smoking status: Former    Packs/day: 0.25    Types: Cigarettes   Smokeless tobacco: Never  Substance Use Topics   Alcohol use: Yes    Alcohol/week: 4.0 standard drinks    Types: 4 Glasses of wine per week   Drug use: No     Allergies   Patient has no known allergies.   Review of Systems Review of Systems Per HPI  Physical Exam Triage Vital Signs ED Triage Vitals  Enc Vitals Group     BP 08/05/21 1154 (!) 174/86     Pulse Rate 08/05/21 1154 96     Resp 08/05/21 1154 16     Temp 08/05/21 1154 (!) 97.5 F (36.4 C)     Temp Source 08/05/21 1154  Oral     SpO2 08/05/21 1154 99 %     Weight --      Height --      Head Circumference --      Peak Flow --      Pain Score 08/05/21 1155 0     Pain Loc --      Pain Edu? --      Excl. in [place]? --    No data found.  Updated Vital Signs BP (!) 162/82 (BP Location: Right Arm)    Pulse 96    Temp (!) 97.5 F (36.4 C) (Oral)    Resp 16    SpO2 99%   Visual Acuity Right Eye Distance:   Left Eye Distance:   Bilateral Distance:    Right Eye Near:   Left Eye Near:    Bilateral Near:     Physical Exam Constitutional:      General: She is not in acute distress.    Appearance: Normal appearance. She is not toxic-appearing or diaphoretic.  HENT:     Head: Normocephalic and atraumatic.     Right Ear: Ear canal normal. A middle ear effusion is present. Tympanic membrane is not perforated, erythematous or bulging.     Left Ear: Ear canal normal. A middle ear effusion is present. Tympanic membrane is not perforated, erythematous or bulging.     Nose: Congestion present.     Mouth/Throat:     Pharynx: No posterior oropharyngeal erythema.  Eyes:     Extraocular Movements: Extraocular movements intact.     Conjunctiva/sclera: Conjunctivae normal.     Pupils: Pupils are equal, round, and reactive to light.  Cardiovascular:     Rate and Rhythm: Normal rate and regular rhythm.     Pulses: Normal pulses.     Heart sounds: Normal heart sounds.  Pulmonary:     Effort: Pulmonary effort is normal. No respiratory distress.     Breath sounds: Normal breath sounds. No stridor. No wheezing, rhonchi or rales.  Neurological:     General: No focal deficit present.     Mental Status: She is alert and oriented to person, place, and time. Mental status is at baseline.     Cranial Nerves: Cranial nerves 2-12 are intact.     Sensory: Sensation is intact.     Motor: Motor function is intact.     Coordination: Coordination is intact.     Gait: Gait is intact.  Psychiatric:        Mood and  Affect: Mood  normal.        Behavior: Behavior normal.        Thought Content: Thought content normal.        Judgment: Judgment normal.     UC Treatments / Results  Labs (all labs ordered are listed, but only abnormal results are displayed) Labs Reviewed  COVID-19, FLU A+B NAA    EKG   Radiology No results found.  Procedures Procedures (including critical care time)  Medications Ordered in UC Medications - No data to display  Initial Impression / Assessment and Plan / UC Course  I have reviewed the triage vital signs and the nursing notes.  Pertinent labs & imaging results that were available during my care of the patient were reviewed by me and considered in my medical decision making (see chart for details).     Patient has elevated blood pressure reading in urgent care today with mild improvement on recheck.  After further review of patient's chart and PCP visits, patient has had elevated blood pressure reading a few times in the past few visits.  Patient currently takes losartan.  Will add amlodipine 2.5 mg to patient's blood pressure regimen.  Patient advised to monitor blood pressure at home and to follow-up with PCP as soon as possible for further evaluation and management of high blood pressure.  Neuro exam is normal and no signs of endorgan damage on exam.  Do not think the patient is in need of immediate medical attention in the hospital at this time as there is no signs of hypertensive urgency.  Patient has acute cough as well but do not think this is related to heart failure or cardiac issue as patient does have nasal congestion on exam as well as bilateral middle ear effusion.  This cough appears consistent with acute viral upper respiratory infection.  Discussed supportive care and symptom management with patient.  COVID-19 and flu test pending.  Discussed return precautions.  Patient verbalized understanding and was agreeable with plan. Final Clinical Impressions(s) / UC  Diagnoses   Final diagnoses:  Essential hypertension  Acute cough     Discharge Instructions      You have been started on a new blood pressure medication.  Please call your primary care today to schedule an appointment to be evaluated sooner.  Monitor blood pressure at home.  COVID test is pending.  We will call if it is positive.    ED Prescriptions     Medication Sig Dispense Auth. Provider   amLODipine (NORVASC) 2.5 MG tablet Take 1 tablet (2.5 mg total) by mouth daily. 30 tablet [place], Michele Rockers, [place]      PDMP not reviewed this encounter.   Teodora Medici, [place] 08/05/21 1301

## 2021-08-05 NOTE — Discharge Instructions (Signed)
You have been started on a new blood pressure medication.  Please call your primary care today to schedule an appointment to be evaluated sooner.  Monitor blood pressure at home.  COVID test is pending.  We will call if it is positive.

## 2021-08-06 LAB — COVID-19, FLU A+B NAA
Influenza A, NAA: NOT DETECTED
Influenza B, NAA: NOT DETECTED
SARS-CoV-2, NAA: NOT DETECTED

## 2021-08-12 DIAGNOSIS — E1121 Type 2 diabetes mellitus with diabetic nephropathy: Secondary | ICD-10-CM | POA: Diagnosis not present

## 2021-08-12 DIAGNOSIS — Z7189 Other specified counseling: Secondary | ICD-10-CM | POA: Diagnosis not present

## 2021-08-12 DIAGNOSIS — I10 Essential (primary) hypertension: Secondary | ICD-10-CM | POA: Diagnosis not present

## 2021-08-12 DIAGNOSIS — E785 Hyperlipidemia, unspecified: Secondary | ICD-10-CM | POA: Diagnosis not present

## 2021-08-27 DIAGNOSIS — M278 Other specified diseases of jaws: Secondary | ICD-10-CM | POA: Diagnosis not present

## 2021-09-06 DIAGNOSIS — E1121 Type 2 diabetes mellitus with diabetic nephropathy: Secondary | ICD-10-CM | POA: Diagnosis not present

## 2021-09-06 DIAGNOSIS — I10 Essential (primary) hypertension: Secondary | ICD-10-CM | POA: Diagnosis not present

## 2021-09-09 ENCOUNTER — Other Ambulatory Visit: Payer: Self-pay

## 2021-09-09 ENCOUNTER — Ambulatory Visit (INDEPENDENT_AMBULATORY_CARE_PROVIDER_SITE_OTHER): Payer: HMO | Admitting: Endocrinology

## 2021-09-09 VITALS — BP 132/80 | HR 71 | Ht 66.0 in | Wt 167.4 lb

## 2021-09-09 DIAGNOSIS — E039 Hypothyroidism, unspecified: Secondary | ICD-10-CM | POA: Diagnosis not present

## 2021-09-09 LAB — T4, FREE: Free T4: 0.65 ng/dL (ref 0.60–1.60)

## 2021-09-09 LAB — TSH: TSH: 1.74 u[IU]/mL (ref 0.35–5.50)

## 2021-09-09 NOTE — Patient Instructions (Addendum)
Blood tests are requested for you today.  We'll let you know about the results.  Please recheck to make sure your levothyroxine is 100 mcg per day. Please come back for a follow-up appointment in 3 months.

## 2021-09-09 NOTE — Progress Notes (Signed)
Subjective:    Patient ID: Tabitha Weaver, female    DOB: [birth year], 76 y.o.   MRN: [id number]  HPI Pt returns for f/u of chronic primary hypothyroidism (dx'ed 2010; she has been synthroid since dx; she has never had thyroid imaging).  Pt states she feels well in general.  Pt says she takes synthroid as rx'ed.   Past Medical History:  Diagnosis Date   High cholesterol    Hypertension    Osteoporosis    Thyroid disease     Past Surgical History:  Procedure Laterality Date   ABDOMINAL HYSTERECTOMY  1985   ROTATOR CUFF REPAIR Right 09/2013, 1995    Social History   Socioeconomic History   Marital status: Single    Spouse name: Not on file   Number of children: Not on file   Years of education: Not on file   Highest education level: Not on file  Occupational History   Not on file  Tobacco Use   Smoking status: Former    Packs/day: 0.25    Types: Cigarettes   Smokeless tobacco: Never  Substance and Sexual Activity   Alcohol use: Yes    Alcohol/week: 4.0 standard drinks    Types: 4 Glasses of wine per week   Drug use: No   Sexual activity: Not on file  Other Topics Concern   Not on file  Social History Narrative   Not on file   Social Determinants of Health   Financial Resource Strain: Not on file  Food Insecurity: Not on file  Transportation Needs: Not on file  Physical Activity: Not on file  Stress: Not on file  Social Connections: Not on file  Intimate Partner Violence: Not on file    Current Outpatient Medications on File Prior to Visit  Medication Sig Dispense Refill   amLODipine (NORVASC) 2.5 MG tablet Take 1 tablet (2.5 mg total) by mouth daily. 30 tablet 2   aspirin EC 81 MG tablet Take 81 mg by mouth daily.     atorvastatin (LIPITOR) 20 MG tablet Take 20 mg by mouth daily.     Ergocalciferol (VITAMIN D2 PO) Take by mouth.     gabapentin (NEURONTIN) 300 MG capsule Take 1 capsule (300 mg total) by mouth 3 (three) times daily. 90 capsule 3   Lancets  (ONETOUCH DELICA PLUS LANCET30G) MISC SMARTSIG:Via Meter     levothyroxine (SYNTHROID) 100 MCG tablet Take 1 tablet (100 mcg total) by mouth daily. 90 tablet 3   losartan (COZAAR) 100 MG tablet Take 100 mg by mouth daily.     metFORMIN (GLUCOPHAGE) 500 MG tablet Take by mouth 2 (two) times daily with a meal.     PARoxetine (PAXIL) 10 MG tablet Take 10 mg by mouth every morning.     venlafaxine XR (EFFEXOR-XR) 37.5 MG 24 hr capsule Take 37.5 mg by mouth daily.     VITAMIN E PO Take by mouth daily.     No current facility-administered medications on file prior to visit.    No Known Allergies  Family History  Problem Relation Age of Onset   Thyroid disease Daughter    Colon cancer Neg Hx    Esophageal cancer Neg Hx    Rectal cancer Neg Hx    Stomach cancer Neg Hx     BP 132/80    Pulse 71    Ht 5\' 6"  (1.676 m)    Wt 167 lb 6.4 oz (75.9 kg)    SpO2 98%  BMI 27.02 kg/m   Review of Systems     Objective:   Physical Exam VITAL SIGNS:  See vs page GENERAL: no distress NECK: There is no palpable thyroid enlargement.  No thyroid nodule is palpable.  No palpable lymphadenopathy at the anterior neck.    Lab Results  Component Value Date   TSH 1.74 09/09/2021      Assessment & Plan:  Hypothyroidism: well-controlled.  Please continue the same synthroid.

## 2021-11-29 ENCOUNTER — Ambulatory Visit: Payer: HMO | Admitting: Neurology

## 2021-12-09 ENCOUNTER — Ambulatory Visit: Payer: HMO | Admitting: Endocrinology

## 2021-12-24 DIAGNOSIS — R7309 Other abnormal glucose: Secondary | ICD-10-CM | POA: Diagnosis not present

## 2021-12-24 DIAGNOSIS — E559 Vitamin D deficiency, unspecified: Secondary | ICD-10-CM | POA: Diagnosis not present

## 2021-12-24 DIAGNOSIS — I10 Essential (primary) hypertension: Secondary | ICD-10-CM | POA: Diagnosis not present

## 2021-12-24 DIAGNOSIS — E039 Hypothyroidism, unspecified: Secondary | ICD-10-CM | POA: Diagnosis not present

## 2021-12-24 DIAGNOSIS — E785 Hyperlipidemia, unspecified: Secondary | ICD-10-CM | POA: Diagnosis not present

## 2022-01-03 DIAGNOSIS — R82998 Other abnormal findings in urine: Secondary | ICD-10-CM | POA: Diagnosis not present

## 2022-01-03 DIAGNOSIS — Z1339 Encounter for screening examination for other mental health and behavioral disorders: Secondary | ICD-10-CM | POA: Diagnosis not present

## 2022-01-03 DIAGNOSIS — E1121 Type 2 diabetes mellitus with diabetic nephropathy: Secondary | ICD-10-CM | POA: Diagnosis not present

## 2022-01-03 DIAGNOSIS — I10 Essential (primary) hypertension: Secondary | ICD-10-CM | POA: Diagnosis not present

## 2022-01-03 DIAGNOSIS — Z0000 Encounter for general adult medical examination without abnormal findings: Secondary | ICD-10-CM | POA: Diagnosis not present

## 2022-01-03 DIAGNOSIS — F17200 Nicotine dependence, unspecified, uncomplicated: Secondary | ICD-10-CM | POA: Diagnosis not present

## 2022-01-03 DIAGNOSIS — Z1331 Encounter for screening for depression: Secondary | ICD-10-CM | POA: Diagnosis not present

## 2022-01-03 DIAGNOSIS — E039 Hypothyroidism, unspecified: Secondary | ICD-10-CM | POA: Diagnosis not present

## 2022-01-03 DIAGNOSIS — E785 Hyperlipidemia, unspecified: Secondary | ICD-10-CM | POA: Diagnosis not present

## 2022-01-03 DIAGNOSIS — R232 Flushing: Secondary | ICD-10-CM | POA: Diagnosis not present

## 2022-01-03 DIAGNOSIS — J302 Other seasonal allergic rhinitis: Secondary | ICD-10-CM | POA: Diagnosis not present

## 2022-01-24 NOTE — Progress Notes (Signed)
NEUROLOGY FOLLOW UP OFFICE NOTE  Tabitha Weaver [id number]  Assessment/Plan:   Probable migraine without aura, triggered by underlying hot flashes.  Now that hot flashes are controlled, headaches resolved.   Continue management of hot flashes and hypothyroidism Follow up as needed.     Subjective:  Tabitha Weaver is a 76 year old female with HTN and thyroid disease who follows up for headache and dizziness.  UPDATE: Once she started taking the venlafaxine more consistently and had adjustments to her thyroid medication, the hot flashes became controlled and headaches resolved.  Doing well   HISTORY: In April-May 2023, she began having hot flashes and headache every night.  She wakes up in the middle of the night with the headaches and hot flashes.  Headaches are severe right sided throbbing headache with right eye pain and blurred vision.  Initially had associated nausea and cold sweats but that subsided.  No photophobia or phonophobia, numbness or weakness..  They used to last a while but now less than an hour.  They used to be daily but now occur every other night.  She followed up with her gynecologist and PCP who adjusted her thyroid medication, but symptoms continued.  She was seen in the ED on 04/13/2021.  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  CTA of head and neck showed no aneurysm, dissection or significant stenosis.  Sed rate was 5 and CRP was < 0.5.  TSH was 0.078.   Reports remote history of migraines but this is different.     Past medications:  gabapentin, paroxetine  PAST MEDICAL HISTORY: Past Medical History:  Diagnosis Date   High cholesterol    Hypertension    Osteoporosis    Thyroid disease     MEDICATIONS: Current Outpatient Medications on File Prior to Visit  Medication Sig Dispense Refill   amLODipine (NORVASC) 2.5 MG tablet Take 1 tablet (2.5 mg total) by mouth daily. 30 tablet 2   aspirin EC 81 MG tablet  Take 81 mg by mouth daily.     atorvastatin (LIPITOR) 20 MG tablet Take 20 mg by mouth daily.     Ergocalciferol (VITAMIN D2 PO) Take by mouth.     gabapentin (NEURONTIN) 300 MG capsule Take 1 capsule (300 mg total) by mouth 3 (three) times daily. 90 capsule 3   Lancets (ONETOUCH DELICA PLUS LANCET30G) MISC SMARTSIG:Via Meter     levothyroxine (SYNTHROID) 100 MCG tablet Take 1 tablet (100 mcg total) by mouth daily. 90 tablet 3   losartan (COZAAR) 100 MG tablet Take 100 mg by mouth daily.     metFORMIN (GLUCOPHAGE) 500 MG tablet Take by mouth 2 (two) times daily with a meal.     PARoxetine (PAXIL) 10 MG tablet Take 10 mg by mouth every morning.     venlafaxine XR (EFFEXOR-XR) 37.5 MG 24 hr capsule Take 37.5 mg by mouth daily.     VITAMIN E PO Take by mouth daily.     No current facility-administered medications on file prior to visit.    ALLERGIES: No Known Allergies  FAMILY HISTORY: Family History  Problem Relation Age of Onset   Thyroid disease Daughter    Colon cancer Neg Hx    Esophageal cancer Neg Hx    Rectal cancer Neg Hx    Stomach cancer Neg Hx       Objective:  Blood pressure 122/71, pulse 73, height 5\' 7"  (1.702 m), weight 169 lb (76.7 kg).  General: No acute distress.  Patient appears well-groomed.   Head:  Normocephalic/atraumatic Eyes:  Fundi examined but not visualized Neck: supple, no paraspinal tenderness, full range of motion Heart:  Regular rate and rhythm Neurological Exam: alert and oriented to person, place, and time.  Speech fluent and not dysarthric, language intact.  CN II-XII intact. Bulk and tone normal, muscle strength 5/5 throughout.  Sensation to light touch intact.  Deep tendon reflexes 2+ throughout.  Finger to nose testing intact.  Gait normal   Shon Millet, DO  CC: Alysia Penna, MD

## 2022-01-25 ENCOUNTER — Ambulatory Visit: Payer: HMO | Admitting: Neurology

## 2022-01-25 ENCOUNTER — Encounter: Payer: Self-pay | Admitting: Neurology

## 2022-01-25 VITALS — BP 122/71 | HR 73 | Ht 67.0 in | Wt 169.0 lb

## 2022-01-25 DIAGNOSIS — G43009 Migraine without aura, not intractable, without status migrainosus: Secondary | ICD-10-CM | POA: Diagnosis not present

## 2022-05-06 DIAGNOSIS — R040 Epistaxis: Secondary | ICD-10-CM | POA: Diagnosis not present

## 2022-05-12 DIAGNOSIS — R040 Epistaxis: Secondary | ICD-10-CM | POA: Diagnosis not present

## 2022-05-21 ENCOUNTER — Emergency Department (HOSPITAL_COMMUNITY): Payer: HMO

## 2022-05-21 ENCOUNTER — Emergency Department (HOSPITAL_COMMUNITY)
Admission: EM | Admit: 2022-05-21 | Discharge: 2022-05-22 | Disposition: A | Discharge status: Home / Self Care | Payer: HMO | Attending: Emergency Medicine | Admitting: Emergency Medicine

## 2022-05-21 DIAGNOSIS — Z7982 Long term (current) use of aspirin: Secondary | ICD-10-CM | POA: Diagnosis not present

## 2022-05-21 DIAGNOSIS — I10 Essential (primary) hypertension: Secondary | ICD-10-CM | POA: Diagnosis not present

## 2022-05-21 DIAGNOSIS — F17200 Nicotine dependence, unspecified, uncomplicated: Secondary | ICD-10-CM | POA: Insufficient documentation

## 2022-05-21 DIAGNOSIS — Z79899 Other long term (current) drug therapy: Secondary | ICD-10-CM | POA: Diagnosis not present

## 2022-05-21 DIAGNOSIS — R011 Cardiac murmur, unspecified: Secondary | ICD-10-CM | POA: Insufficient documentation

## 2022-05-21 DIAGNOSIS — Z7984 Long term (current) use of oral hypoglycemic drugs: Secondary | ICD-10-CM | POA: Insufficient documentation

## 2022-05-21 DIAGNOSIS — R55 Syncope and collapse: Secondary | ICD-10-CM | POA: Diagnosis not present

## 2022-05-21 DIAGNOSIS — E039 Hypothyroidism, unspecified: Secondary | ICD-10-CM | POA: Diagnosis not present

## 2022-05-21 DIAGNOSIS — R040 Epistaxis: Secondary | ICD-10-CM | POA: Diagnosis not present

## 2022-05-21 DIAGNOSIS — Y902 Blood alcohol level of 40-59 mg/100 ml: Secondary | ICD-10-CM | POA: Diagnosis not present

## 2022-05-21 DIAGNOSIS — E119 Type 2 diabetes mellitus without complications: Secondary | ICD-10-CM | POA: Diagnosis not present

## 2022-05-21 LAB — CBC WITH DIFFERENTIAL/PLATELET
Abs Immature Granulocytes: 0.03 10*3/uL (ref 0.00–0.07)
Basophils Absolute: 0.1 10*3/uL (ref 0.0–0.1)
Basophils Relative: 1 %
Eosinophils Absolute: 0 10*3/uL (ref 0.0–0.5)
Eosinophils Relative: 0 %
HCT: 41.1 % (ref 36.0–46.0)
Hemoglobin: 13.1 g/dL (ref 12.0–15.0)
Immature Granulocytes: 0 %
Lymphocytes Relative: 12 %
Lymphs Abs: 1.1 10*3/uL (ref 0.7–4.0)
MCH: 30.3 pg (ref 26.0–34.0)
MCHC: 31.9 g/dL (ref 30.0–36.0)
MCV: 95.1 fL (ref 80.0–100.0)
Monocytes Absolute: 0.5 10*3/uL (ref 0.1–1.0)
Monocytes Relative: 6 %
Neutro Abs: 7.5 10*3/uL (ref 1.7–7.7)
Neutrophils Relative %: 81 %
Platelets: 220 10*3/uL (ref 150–400)
RBC: 4.32 MIL/uL (ref 3.87–5.11)
RDW: 13.4 % (ref 11.5–15.5)
WBC: 9.3 10*3/uL (ref 4.0–10.5)
nRBC: 0 % (ref 0.0–0.2)

## 2022-05-21 LAB — COMPREHENSIVE METABOLIC PANEL
ALT: 15 U/L (ref 0–44)
AST: 15 U/L (ref 15–41)
Albumin: 3.5 g/dL (ref 3.5–5.0)
Alkaline Phosphatase: 62 U/L (ref 38–126)
Anion gap: 10 (ref 5–15)
BUN: 13 mg/dL (ref 8–23)
CO2: 22 mmol/L (ref 22–32)
Calcium: 9.1 mg/dL (ref 8.9–10.3)
Chloride: 111 mmol/L (ref 98–111)
Creatinine, Ser: 0.82 mg/dL (ref 0.44–1.00)
GFR, Estimated: >60 mL/min (ref >60)
Glucose, Bld: 134 mg/dL — ABNORMAL HIGH (ref 70–99)
Potassium: 3.8 mmol/L (ref 3.5–5.1)
Sodium: 143 mmol/L (ref 135–145)
Total Bilirubin: 0.4 mg/dL (ref 0.3–1.2)
Total Protein: 6.3 g/dL — ABNORMAL LOW (ref 6.5–8.1)

## 2022-05-21 LAB — ETHANOL: Alcohol, Ethyl (B): 46 mg/dL — ABNORMAL HIGH (ref <10)

## 2022-05-21 LAB — TROPONIN I (HIGH SENSITIVITY): Troponin I (High Sensitivity): 6 ng/L (ref <18)

## 2022-05-21 NOTE — ED Provider Notes (Signed)
[facility] EMERGENCY DEPARTMENT Provider Note   CSN: [id number] Arrival date & time: 05/21/22  1851     History {Add pertinent medical, surgical, social history, OB history to HPI:1} Chief Complaint  Patient presents with   Loss of Consciousness    Tabitha Weaver is a 76 y.o. female.  Patient is a 76 year old female with a history of hypertension, diabetes, hypothyroidism who is presenting today with EMS after a syncopal event at her neighbor's house.  Patient reports she has been feeling her normal self she was at the neighbor's today and that is all she remembers.  Family reports that the neighbor told them they were sitting and talking when she suddenly started not acting right and had some slurred speech and then became unresponsive.  They lowered her to the floor but she did not fall.  They called 911 and by the time EMS arrived patient was mentating but was noted to have a blood pressure of 72/42 and then 92/52.  Patient reports that she did lose control of her bladder and was very sweaty.  She reports currently she feels fine.  She denies any chest pain, palpitations or shortness of breath before or after the event.  She denies any history of syncope.  She has been having intermittent nosebleeds which she has been seeing the ENT for but denies any bleeding today.  She does drink alcohol occasionally but not every day and reports she has been eating and drinking normally.  She started no new medications.  She has been compliant with her home meds.  She does continue to use tobacco products.  She denies any prior history of PE or DVT.  No known history of heart disease.  The history is provided by the patient, a relative and the EMS personnel.  Loss of Consciousness      Home Medications Prior to Admission medications   Medication Sig Start Date End Date Taking? Authorizing Provider  amLODipine (NORVASC) 2.5 MG tablet Take 1 tablet (2.5 mg total) by mouth daily.  08/05/21   Teodora Medici, FNP  aspirin EC 81 MG tablet Take 81 mg by mouth daily.    [provider]  atorvastatin (LIPITOR) 20 MG tablet Take 20 mg by mouth daily.    [provider]  Ergocalciferol (VITAMIN D2 PO) Take by mouth.    [provider]  Lancets (ONETOUCH DELICA PLUS [id number]) [place] Meter 03/24/21   [provider]  levothyroxine (SYNTHROID) 100 MCG tablet Take 1 tablet (100 mcg total) by mouth daily. 07/01/21   Renato Shin, MD  levothyroxine (SYNTHROID) 100 MCG tablet Take 1 tablet by mouth daily.    [provider]  losartan (COZAAR) 100 MG tablet Take 100 mg by mouth daily.    [provider]  metFORMIN (GLUCOPHAGE) 500 MG tablet Take by mouth 2 (two) times daily with a meal.    [provider]  venlafaxine XR (EFFEXOR-XR) 37.5 MG 24 hr capsule Take 37.5 mg by mouth daily. 04/21/21   [provider]  VITAMIN E PO Take by mouth daily.    [provider]      Allergies    Patient has no known allergies.    Review of Systems   Review of Systems  Cardiovascular:  Positive for syncope.    Physical Exam Updated Vital Signs BP 111/76   Pulse 66   Temp 97.6 F (36.4 C) (Oral)   Resp 15   SpO2 90%  Physical Exam Vitals and nursing note reviewed.  Constitutional:      General: She is not in acute distress.    Appearance: She is well-developed.  HENT:     Head: Normocephalic and atraumatic.  Eyes:     Pupils: Pupils are equal, round, and reactive to light.  Cardiovascular:     Rate and Rhythm: Normal rate and regular rhythm.     Heart sounds: Murmur heard.     No friction rub.     Comments: Faint 2 out of 6 systolic murmur heard best at the left sternal border Pulmonary:     Effort: Pulmonary effort is normal.     Breath sounds: Normal breath sounds. No wheezing or rales.     Comments: Globally decreased breath sounds Abdominal:     General: Bowel sounds are normal. There  is no distension.     Palpations: Abdomen is soft.     Tenderness: There is no abdominal tenderness. There is no guarding or rebound.  Musculoskeletal:        General: No tenderness. Normal range of motion.     Right lower leg: No edema.     Left lower leg: No edema.     Comments: No edema  Skin:    General: Skin is warm and dry.     Findings: No rash.  Neurological:     Mental Status: She is alert and oriented to person, place, and time.     Cranial Nerves: No cranial nerve deficit.  Psychiatric:        Behavior: Behavior normal.     ED Results / Procedures / Treatments   Labs (all labs ordered are listed, but only abnormal results are displayed) Labs Reviewed  CBC WITH DIFFERENTIAL/PLATELET  COMPREHENSIVE METABOLIC PANEL  ETHANOL  TROPONIN I (HIGH SENSITIVITY)    EKG EKG Interpretation  Date/Time:  Saturday May 21 2022 19:02:31 EDT Ventricular Rate:  66 PR Interval:  278 QRS Duration: 92 QT Interval:  440 QTC Calculation: 461 R Axis:   20 Text Interpretation: Sinus rhythm Prolonged PR interval LVH with secondary repolarization abnormality Baseline wander Confirmed by Gwyneth Sprout ([id number]) on 05/21/2022 7:27:02 PM  Radiology CT Head Wo Contrast  Result Date: 05/21/2022 CLINICAL DATA:  Syncope. EXAM: CT HEAD WITHOUT CONTRAST TECHNIQUE: Contiguous axial images were obtained from the base of the skull through the vertex without intravenous contrast. RADIATION DOSE REDUCTION: This exam was performed according to the departmental dose-optimization program which includes automated exposure control, adjustment of the mA and/or kV according to patient size and/or use of iterative reconstruction technique. COMPARISON:  April 13, 2021. 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 No mass effect or midline shift is noted. Ventricular size is within normal limits. There is no evidence of mass lesion, hemorrhage or acute  infarction. Vascular: No hyperdense vessel or unexpected calcification. Skull: Normal. Negative for fracture or focal lesion. Sinuses/Orbits: No acute finding. Other: None. IMPRESSION: No acute intracranial abnormality seen. Electronically Signed   By: Lupita Raider M.D.   On: 05/21/2022 20:30   DG Chest Port 1 View  Result Date: 05/21/2022 CLINICAL DATA:  Syncope EXAM: PORTABLE CHEST 1 VIEW COMPARISON:  CT 03/09/2016 FINDINGS: Patient rotation limits examination. Heart size and pulmonary vascularity are normal. Lungs are clear. No pleural effusions. No pneumothorax. Mediastinal contours appear intact. IMPRESSION: No active disease. Electronically Signed   By: Burman Nieves M.D.   On: 05/21/2022 20:10    Procedures Procedures  {  Document cardiac monitor, telemetry assessment procedure when appropriate:1}  Medications Ordered in ED Medications - No data to display  ED Course/ Medical Decision Making/ A&P                           Medical Decision Making Amount and/or Complexity of Data Reviewed Labs: ordered. Radiology: ordered.   Pt with multiple medical problems and comorbidities and presenting today with a complaint that caries a high risk for morbidity and mortality.  Here today with a syncopal event.  Patient was apparently sitting when the event happened and had no prodromal symptoms.  She was diaphoretic and hypotensive after the event per EMS.  Patient's vital signs of been normal here.  Concern for dysrhythmia versus orthostatic hypotension versus anemia versus acute cardiac pathology.  I independently interpreted patient's EKG and she does have new deep T wave inversion laterally which was not present in her last EKG in 2020.  I have independently visualized and interpreted pt's images today.  Head CT without acute processes and chest x-ray without evidence of cardiomegaly or acute lung pathology.    {Document critical care time when appropriate:1} {Document review of labs  and clinical decision tools ie heart score, Chads2Vasc2 etc:1}  {Document your independent review of radiology images, and any outside records:1} {Document your discussion with family members, caretakers, and with consultants:1} {Document social determinants of health affecting pt's care:1} {Document your decision making why or why not admission, treatments were needed:1} Final Clinical Impression(s) / ED Diagnoses Final diagnoses:  None    Rx / DC Orders ED Discharge Orders     None

## 2022-05-21 NOTE — ED Triage Notes (Signed)
Pt BIBA from home for a witnessed syncopal episode in front of neighbors. Pt was confused upon awakening  with loss of bladder control. Hx of DM.  Pts BP with EMS was 72/42 then 92/52. CBG 147

## 2022-05-22 NOTE — ED Notes (Signed)
Patient verbalizes understanding of d/c instructions. Opportunities for questions and answers were provided. Pt d/c from ED and ambulated to lobby with family.  

## 2022-05-22 NOTE — Discharge Instructions (Addendum)
All the blood test looked good today.  No signs of heart attack.  You do have some changes on your EKG today that were not there 3 years ago.  It is unclear how long they have been there but you should follow-up with your doctor about this.  Otherwise the scan of your head and your chest x-ray looked good.  No signs of stroke.  Make sure you are staying hydrated and eating regularly.  If you start getting chest pain, shortness of breath or any other episodes of passing out you should return to the emergency room.

## 2022-05-25 ENCOUNTER — Encounter: Payer: Self-pay | Admitting: Cardiology

## 2022-05-25 ENCOUNTER — Ambulatory Visit: Payer: HMO | Admitting: Cardiology

## 2022-05-25 VITALS — BP 136/75 | HR 68 | Temp 98.0°F | Resp 16 | Ht 67.0 in | Wt 187.1 lb

## 2022-05-25 DIAGNOSIS — E1165 Type 2 diabetes mellitus with hyperglycemia: Secondary | ICD-10-CM

## 2022-05-25 DIAGNOSIS — F17200 Nicotine dependence, unspecified, uncomplicated: Secondary | ICD-10-CM

## 2022-05-25 DIAGNOSIS — R55 Syncope and collapse: Secondary | ICD-10-CM

## 2022-05-25 DIAGNOSIS — R9431 Abnormal electrocardiogram [ECG] [EKG]: Secondary | ICD-10-CM

## 2022-05-25 DIAGNOSIS — E782 Mixed hyperlipidemia: Secondary | ICD-10-CM

## 2022-05-25 DIAGNOSIS — I10 Essential (primary) hypertension: Secondary | ICD-10-CM

## 2022-05-25 NOTE — Progress Notes (Signed)
ID:  Tabitha Weaver, DOB May 07, 1946, MRN [id number]  PCP:  Velna Hatchet, MD  Cardiologist:  Rex Kras, DO, [facility] (established care 05/25/2022)  REASON FOR CONSULT: Abnormal EKG, recent syncope  REQUESTING PHYSICIAN:  Velna Hatchet, MD [address] [place],  [place] [address]  Chief Complaint  Patient presents with   Loss of Consciousness   New Patient (Initial Visit)    HPI  Tabitha Weaver is a 76 y.o. African-American female who presents to the clinic for evaluation of abnormal EKG, recent syncope at the request of Velna Hatchet, MD. Her past medical history and cardiovascular risk factors include: Hypothyroidism, diabetes mellitus type 2 with nephropathy, hypertension, anemia, hyperlipidemia, nicotine dependence, postmenopausal female.  Patient is accompanied by her granddaughter Tabitha Weaver at today's office visit.  Patient provides verbal consent with having Tabitha Weaver present during today's encounter.  On 05/21/2022 patient was at her neighbor's house in her normal state but started experiencing slurred speech, diaphoresis and soon thereafter states that she lost consciousness (duration unknown).  Patient did not injure herself.  EMS arrived and based on EMR she was noted to have hypotension with SBP less than 90 mmHg.  Patient states that she was dehydrated, had taken her blood pressure medications (which includes amlodipine), was drinking wine prior to the event.  Patient did lose control of her bladder but no tongue biting or tonic-clonic movements of hands or feet.  In the emergency room department she underwent laboratory testing, results independently reviewed.  Alcohol level is elevated.  EKG shows sinus rhythm with ST-T changes in the high lateral leads.  No prior EKGs available for comparison.  She was recommended to be hospitalized for further observation but shows outpatient follow-up.  Patient has not been evaluated by neurology.   She drives a motor vehicle.  And smokes  3 cigarettes/day at least.  ALLERGIES: No Known Allergies  MEDICATION LIST PRIOR TO VISIT: Current Meds  Medication Sig   amLODipine (NORVASC) 2.5 MG tablet Take 1 tablet (2.5 mg total) by mouth daily. (Patient taking differently: Take 5 mg by mouth daily.)   aspirin EC 81 MG tablet Take 81 mg by mouth daily.   atorvastatin (LIPITOR) 20 MG tablet Take 20 mg by mouth daily.   Ergocalciferol (VITAMIN D2 PO) Take by mouth.   fluticasone (FLONASE) 50 MCG/ACT nasal spray fluticasone propionate 50 mcg/actuation nasal spray,suspension   Lancets (ONETOUCH DELICA PLUS [id number]) MISC SMARTSIG:Via Meter   levocetirizine (XYZAL) 5 MG tablet 1 tablet in the evening Orally   levothyroxine (SYNTHROID) 100 MCG tablet Take 1 tablet by mouth daily.   losartan (COZAAR) 100 MG tablet Take 100 mg by mouth daily.   metFORMIN (GLUCOPHAGE) 500 MG tablet Take by mouth 2 (two) times daily with a meal.   venlafaxine XR (EFFEXOR-XR) 37.5 MG 24 hr capsule Take 37.5 mg by mouth daily.     PAST MEDICAL HISTORY: Past Medical History:  Diagnosis Date   High cholesterol    Hypertension    Osteoporosis    Thyroid disease     PAST SURGICAL HISTORY: Past Surgical History:  Procedure Laterality Date   ABDOMINAL HYSTERECTOMY  1985   ROTATOR CUFF REPAIR Right 09/2013, 1995    FAMILY HISTORY: The patient family history includes Heart attack in her father and mother; Thyroid disease in her daughter.  SOCIAL HISTORY:  The patient  reports that she has been smoking cigarettes. She has been smoking an average of .25 packs per day. She has never used smokeless tobacco.  She reports current alcohol use of about 4.0 standard drinks of alcohol per week. She reports that she does not use drugs.  REVIEW OF SYSTEMS: Review of Systems  Cardiovascular:  Positive for syncope. Negative for chest pain, claudication, dyspnea on exertion, irregular heartbeat, leg swelling, near-syncope, orthopnea, palpitations and paroxysmal  nocturnal dyspnea.  Respiratory:  Negative for shortness of breath.   Hematologic/Lymphatic: Negative for bleeding problem.  Musculoskeletal:  Negative for muscle cramps and myalgias.  Neurological:  Negative for dizziness and light-headedness.    PHYSICAL EXAM:    05/25/2022    1:52 PM 05/22/2022   12:00 AM 05/21/2022   11:30 PM  Vitals with BMI  Height 5\' 7"     Weight 187 lbs 2 oz    BMI [id number]    Systolic [id number]  Diastolic 75 80 76  Pulse 68 65 65   Orthostatic VS for the past 72 hrs (Last 3 readings):  Orthostatic BP Patient Position BP Location Cuff Size Orthostatic Pulse  05/25/22 1356 124/74 Standing Left Arm Normal 76  05/25/22 1355 124/78 Sitting Left Arm Normal 71  05/25/22 1354 130/77 Supine Left Arm Normal 67     Physical Exam  Constitutional: No distress.  Appears older than stated age, hemodynamically stable.   Neck: No JVD present.  Cardiovascular: Normal rate, regular rhythm, S1 normal, S2 normal, intact distal pulses and normal pulses. Exam reveals no gallop, no S3 and no S4.  Murmur heard. Systolic murmur is present with a grade of 3/6 at the apex. Pulmonary/Chest: Effort normal and breath sounds normal. No stridor. She has no wheezes. She has no rales.  Abdominal: Soft. Bowel sounds are normal. She exhibits no distension. There is no abdominal tenderness.  Musculoskeletal:        General: No edema.     Cervical back: Neck supple.  Neurological: She is alert and oriented to person, place, and time. She has intact cranial nerves (2-12).  Skin: Skin is warm and moist.   CARDIAC DATABASE: EKG: 05/25/2022: Normal sinus rhythm, 68 bpm, first-degree AV block, occasional PACs, ST-T changes in the high lateral leads consider ischemia.  Echocardiogram: No results found for this or any previous visit from the past 1095 days.    Stress Testing: No results found for this or any previous visit from the past 1095 days.   Heart  Catheterization: None  LABORATORY DATA:    Latest Ref Rng & Units 05/21/2022    9:53 PM 04/13/2021    2:59 PM  CBC  WBC 4.0 - 10.5 K/uL 9.3  8.8   Hemoglobin 12.0 - 15.0 g/dL 13.1  15.8   Hematocrit 36.0 - 46.0 % 41.1  48.2   Platelets 150 - 400 K/uL 220  214        Latest Ref Rng & Units 05/21/2022    9:53 PM 04/13/2021    2:59 PM  CMP  Glucose 70 - 99 mg/dL 134  93   BUN 8 - 23 mg/dL 13  13   Creatinine 0.44 - 1.00 mg/dL 0.82  0.78   Sodium 135 - 145 mmol/L 143  140   Potassium 3.5 - 5.1 mmol/L 3.8  4.1   Chloride 98 - 111 mmol/L 111  104   CO2 22 - 32 mmol/L 22  27   Calcium 8.9 - 10.3 mg/dL 9.1  9.6   Total Protein 6.5 - 8.1 g/dL 6.3  7.2   Total Bilirubin 0.3 - 1.2 mg/dL 0.4  0.6  Alkaline Phos 38 - 126 U/L 62  70   AST 15 - 41 U/L 15  18   ALT 0 - 44 U/L 15  20     Lipid Panel  No results found for: "CHOL", "TRIG", "HDL", "CHOLHDL", "VLDL", "LDLCALC", "LDLDIRECT", "LABVLDL"  No components found for: "NTPROBNP" No results for input(s): "PROBNP" in the last 8760 hours. Recent Labs    07/01/21 0957 09/09/21 1438  TSH 6.30* 1.74    BMP Recent Labs    05/21/22 2153  NA 143  K 3.8  CL 111  CO2 22  GLUCOSE 134*  BUN 13  CREATININE 0.82  CALCIUM 9.1  GFRNONAA >60    HEMOGLOBIN A1C No results found for: "HGBA1C", "MPG"  IMPRESSION:    ICD-10-CM   1. Syncope and collapse  R55 EKG 12-Lead    LONG TERM MONITOR-LIVE TELEMETRY (3-14 DAYS)    PCV ECHOCARDIOGRAM COMPLETE    PCV MYOCARDIAL PERFUSION WO LEXISCAN    TSH    2. Abnormal electrocardiogram  R94.31 PCV MYOCARDIAL PERFUSION WO LEXISCAN    3. Type 2 diabetes mellitus with hyperglycemia, without long-term current use of insulin (HCC)  E11.65     4. Benign hypertension  I10     5. Mixed hyperlipidemia  E78.2     6. Smoking  F17.200        RECOMMENDATIONS: HENNESSY BUCKALOO is a 76 y.o. African-American female whose past medical history and cardiac risk factors include: Hypothyroidism,  diabetes mellitus type 2 with nephropathy, hypertension, anemia, hyperlipidemia, nicotine dependence, postmenopausal female.  Syncope and collapse Multifactorial: Likely vasovagal, secondary to EtOH, dehydration,  Orthostatic vital signs negative. EKG shows sinus rhythm with ST-T changes in the high lateral leads cannot rule out ischemia.  No prior ECGs available for comparison. High sensitive troponins negative x1. EtOH 46 mg/dL (normal levels <10 mg/dL).  Check TSH. 2-week telemetry to evaluate for underlying dysrhythmias. Echo will be ordered to evaluate for structural heart disease and left ventricular systolic function. Recommend neurology evaluation as well -given her recent loss of consciousness and possibility of seizures that she lost bladder function during this episode. She is aware of [place] driving laws - to stop driving after an episode of loss of consciousness until 6 months event-free.  Abnormal electrocardiogram Noted to have ST-T changes in the high lateral leads concerning for possible anterolateral ischemia. No prior cardiovascular work-up or ECGs available for comparison. Plan exercise nuclear stress test to evaluate for reversible ischemia given her multiple cardiovascular risk factors including diabetes and recent syncope  Type 2 diabetes mellitus with hyperglycemia, without long-term current use of insulin ([place]) Reemphasized importance of glycemic control. Currently on atorvastatin, losartan  Benign hypertension Office blood pressures are well controlled. Currently on amlodipine and losartan. Orthostatic vital signs are negative. If she continues to have episodes of near syncope or syncope would recommend discontinuation of vasodilator therapy.  Mixed hyperlipidemia Currently on statin therapy. Currently managed by primary care provider.  Smoking Tobacco cessation counseling: Currently smoking 3 cigarettes/day Patient is willing to quit at this  time. 5 mins were spent counseling patient cessation techniques. We discussed various methods to help quit smoking, including deciding on a date to quit, joining a support group, pharmacological agents- nicotine gum/patch/lozenges.  I will reassess her progress at the next follow-up visit  FINAL MEDICATION LIST END OF ENCOUNTER: No orders of the defined types were placed in this encounter.   Medications Discontinued During This Encounter  Medication Reason  levothyroxine (SYNTHROID) 100 MCG tablet    VITAMIN E PO    losartan (COZAAR) 100 MG tablet      Current Outpatient Medications:    amLODipine (NORVASC) 2.5 MG tablet, Take 1 tablet (2.5 mg total) by mouth daily. (Patient taking differently: Take 5 mg by mouth daily.), Disp: 30 tablet, Rfl: 2   aspirin EC 81 MG tablet, Take 81 mg by mouth daily., Disp: , Rfl:    atorvastatin (LIPITOR) 20 MG tablet, Take 20 mg by mouth daily., Disp: , Rfl:    Ergocalciferol (VITAMIN D2 PO), Take by mouth., Disp: , Rfl:    fluticasone (FLONASE) 50 MCG/ACT nasal spray, fluticasone propionate 50 mcg/actuation nasal spray,suspension, Disp: , Rfl:    Lancets (ONETOUCH DELICA PLUS [id number]) MISC, SMARTSIG:Via Meter, Disp: , Rfl:    levocetirizine (XYZAL) 5 MG tablet, 1 tablet in the evening Orally, Disp: , Rfl:    levothyroxine (SYNTHROID) 100 MCG tablet, Take 1 tablet by mouth daily., Disp: , Rfl:    losartan (COZAAR) 100 MG tablet, Take 100 mg by mouth daily., Disp: , Rfl:    metFORMIN (GLUCOPHAGE) 500 MG tablet, Take by mouth 2 (two) times daily with a meal., Disp: , Rfl:    venlafaxine XR (EFFEXOR-XR) 37.5 MG 24 hr capsule, Take 37.5 mg by mouth daily., Disp: , Rfl:   Orders Placed This Encounter  Procedures   TSH   LONG TERM MONITOR-LIVE TELEMETRY (3-14 DAYS)   PCV MYOCARDIAL PERFUSION WO LEXISCAN   EKG 12-Lead   PCV ECHOCARDIOGRAM COMPLETE    There are no Patient Instructions on file for this visit.   --Continue cardiac medications as  reconciled in final medication list. --Return in about 7 weeks (around 07/13/2022) for Follow up syncope. or sooner if needed. --Continue follow-up with your primary care physician regarding the management of your other chronic comorbid conditions.  Patient's questions and concerns were addressed to her satisfaction. She voices understanding of the instructions provided during this encounter.   This note was created using a voice recognition software as a result there may be grammatical errors inadvertently enclosed that do not reflect the nature of this encounter. Every attempt is made to correct such errors.  Rex Kras, [place], [facility]  Pager: [phone] Office: [phone]

## 2022-05-26 ENCOUNTER — Ambulatory Visit: Payer: HMO | Admitting: Cardiology

## 2022-05-30 ENCOUNTER — Ambulatory Visit: Payer: HMO

## 2022-05-30 ENCOUNTER — Telehealth: Payer: Self-pay

## 2022-05-30 DIAGNOSIS — R55 Syncope and collapse: Secondary | ICD-10-CM

## 2022-05-30 NOTE — Progress Notes (Deleted)
NEUROLOGY FOLLOW UP OFFICE NOTE  Tabitha Weaver [id number]  Assessment/Plan:   Syncope- likely vasovagal syncope in setting of dehydration.  Will check EEG to further evaluation possibility of seizure.   Routine EEG Further recommendations pending results.      Subjective:  Tabitha Weaver is a 76 year old female with HTN and thyroid disease who follows up for ED visit.  History supplemented by ED and cardiology notes.  She is accompanied by her granddaughter who supplements further history.  On 10/21, she was sitting and talking with her neighbor when she suddenly ***.  When EMS arrived, she was noted to have a SBP less than 90.  She reports that she had taken her blood pressure medication but didn't drink any liquids except for wine.  She did have urinary incontinence but no seizure-like activity or tongue biting.  She did not exhibit any prolonged confusion when she woke up.  She was brought to the ED for further evaluation.  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  ETOH level was 46.  EKG showed sinus rhythm but with ST-T changes.  She followed up with outpatient cardiology.  Orthostatic vitals were negative.  Two-week cardiac event monitoring was ordered.    PAST MEDICAL HISTORY: Past Medical History:  Diagnosis Date   High cholesterol    Hypertension    Osteoporosis    Thyroid disease     MEDICATIONS: Current Outpatient Medications on File Prior to Visit  Medication Sig Dispense Refill   amLODipine (NORVASC) 2.5 MG tablet Take 1 tablet (2.5 mg total) by mouth daily. (Patient taking differently: Take 5 mg by mouth daily.) 30 tablet 2   aspirin EC 81 MG tablet Take 81 mg by mouth daily.     atorvastatin (LIPITOR) 20 MG tablet Take 20 mg by mouth daily.     Ergocalciferol (VITAMIN D2 PO) Take by mouth.     fluticasone (FLONASE) 50 MCG/ACT nasal spray fluticasone propionate 50 mcg/actuation nasal spray,suspension      Lancets (ONETOUCH DELICA PLUS LANCET30G) MISC SMARTSIG:Via Meter     levocetirizine (XYZAL) 5 MG tablet 1 tablet in the evening Orally     levothyroxine (SYNTHROID) 100 MCG tablet Take 1 tablet by mouth daily.     losartan (COZAAR) 100 MG tablet Take 100 mg by mouth daily.     metFORMIN (GLUCOPHAGE) 500 MG tablet Take by mouth 2 (two) times daily with a meal.     venlafaxine XR (EFFEXOR-XR) 37.5 MG 24 hr capsule Take 37.5 mg by mouth daily.     No current facility-administered medications on file prior to visit.    ALLERGIES: No Known Allergies  FAMILY HISTORY: Family History  Problem Relation Age of Onset   Heart attack Mother    Heart attack Father    Thyroid disease Daughter    Colon cancer Neg Hx    Esophageal cancer Neg Hx    Rectal cancer Neg Hx    Stomach cancer Neg Hx       Objective:  *** General: No acute distress.  Patient appears well-groomed.   Head:  Normocephalic/atraumatic Eyes:  Fundi examined but not visualized Neck: supple, no paraspinal tenderness, full range of motion Heart:  Regular rate and rhythm Neurological Exam: alert and oriented to person, place, and time.  Speech fluent and not dysarthric, language intact.  CN II-XII intact. Bulk and tone normal, muscle strength 5/5 throughout.  Sensation to light touch intact.  Deep tendon reflexes 2+ throughout.  Finger to nose testing intact.  Gait normal   Metta Clines, DO  CC: Velna Hatchet, MD

## 2022-05-30 NOTE — Telephone Encounter (Signed)
     Patient  visit on 10/22   at [facility]   Have you been able to follow up with your primary care physician? yes  The patient was or was not able to obtain any needed medicine or equipment. yes  Are there diet recommendations that you are having difficulty following? na  Patient expresses understanding of discharge instructions and education provided has no other needs at this time. yes     Kailan Carmen [facility] Guide, , Care Management  [phone] [address], [place], [place] [address] Phone: [phone] Email: [email or web address]    

## 2022-05-31 ENCOUNTER — Ambulatory Visit: Payer: HMO | Admitting: Neurology

## 2022-05-31 ENCOUNTER — Encounter: Payer: Self-pay | Admitting: Neurology

## 2022-05-31 DIAGNOSIS — Z0279 Encounter for issue of other medical certificate: Secondary | ICD-10-CM

## 2022-06-02 ENCOUNTER — Ambulatory Visit: Payer: HMO

## 2022-06-08 NOTE — Progress Notes (Signed)
NEUROLOGY FOLLOW UP OFFICE NOTE  Tabitha Weaver [id number]  Assessment/Plan:   Syncope- likely vasovagal syncope in setting of dehydration, but atypical presentation for both seizure and syncope.  Will check EEG to further evaluation possibility of seizure.   Routine EEG Further recommendations pending results.  Discussed [place] law stating no driving for 6 months from possible seizure.  If EEG is okay, I would presume syncope in setting of dehydration and she may then resume driving.     Subjective:  Tabitha Weaver is a 76 year old female with HTN and thyroid disease who follows up for ED visit.  History supplemented by ED and cardiology notes.  She is accompanied by her granddaughter who supplements further history.   On 10/21, she was sitting and talking with her neighbor when she suddenly passed out.  She denies prodromal symptoms.  Woke up in the ambulance.  She was slurring her speech for a little bit.  When EMS arrived, she was noted to have a SBP less than 90.  She reports that she had taken her blood pressure medication but didn't drink any liquids except for wine.  She did have urinary incontinence but no seizure-like activity or tongue biting.  She did not exhibit any prolonged confusion when she woke up.  She was brought to the ED for further evaluation.  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  ETOH level was 46.  EKG showed sinus rhythm but with ST-T changes.  She followed up with outpatient cardiology.  Orthostatic vitals were negative.  Two-week cardiac event monitoring was ordered.  No recurrence.  No history of seizures.  PAST MEDICAL HISTORY: Past Medical History:  Diagnosis Date   High cholesterol    Hypertension    Osteoporosis    Thyroid disease     MEDICATIONS: Current Outpatient Medications on File Prior to Visit  Medication Sig Dispense Refill   amLODipine (NORVASC) 2.5 MG tablet Take 1 tablet (2.5 mg  total) by mouth daily. (Patient taking differently: Take 5 mg by mouth daily.) 30 tablet 2   aspirin EC 81 MG tablet Take 81 mg by mouth daily.     atorvastatin (LIPITOR) 20 MG tablet Take 20 mg by mouth daily.     Ergocalciferol (VITAMIN D2 PO) Take by mouth.     fluticasone (FLONASE) 50 MCG/ACT nasal spray fluticasone propionate 50 mcg/actuation nasal spray,suspension     Lancets (ONETOUCH DELICA PLUS LANCET30G) MISC SMARTSIG:Via Meter     levocetirizine (XYZAL) 5 MG tablet 1 tablet in the evening Orally     levothyroxine (SYNTHROID) 100 MCG tablet Take 1 tablet by mouth daily.     losartan (COZAAR) 100 MG tablet Take 100 mg by mouth daily.     metFORMIN (GLUCOPHAGE) 500 MG tablet Take by mouth 2 (two) times daily with a meal.     venlafaxine XR (EFFEXOR-XR) 37.5 MG 24 hr capsule Take 37.5 mg by mouth daily.     No current facility-administered medications on file prior to visit.    ALLERGIES: No Known Allergies  FAMILY HISTORY: Family History  Problem Relation Age of Onset   Heart attack Mother    Heart attack Father    Thyroid disease Daughter    Colon cancer Neg Hx    Esophageal cancer Neg Hx    Rectal cancer Neg Hx    Stomach cancer Neg Hx       Objective:  Blood pressure 138/73, pulse 73,  height 5\' 7"  (1.702 m), weight 188 lb (85.3 kg), SpO2 99 %. General: No acute distress.  Patient appears well-groomed.   Head:  Normocephalic/atraumatic Eyes:  Fundi examined but not visualized Neck: supple, no paraspinal tenderness, full range of motion Heart:  Regular rate and rhythm Neurological Exam: alert and oriented to person, place, and time.  Speech fluent and not dysarthric, language intact.  CN II-XII intact. Bulk and tone normal, muscle strength 5/5 throughout.  Sensation to light touch intact.  Deep tendon reflexes 2+ throughout.  Finger to nose testing intact.  Gait normal   , DO  CC: Shon Millet, MD

## 2022-06-10 ENCOUNTER — Encounter: Payer: Self-pay | Admitting: Neurology

## 2022-06-10 ENCOUNTER — Ambulatory Visit: Payer: HMO | Admitting: Neurology

## 2022-06-10 VITALS — BP 138/73 | HR 73 | Ht 67.0 in | Wt 188.0 lb

## 2022-06-10 DIAGNOSIS — R55 Syncope and collapse: Secondary | ICD-10-CM

## 2022-06-10 NOTE — Patient Instructions (Signed)
Likely you fainted but we will check an EEG to see if you have abnormalities that suggest possible seizure As per [place] law, no driving for 6 months from last seizure.  If EEG looks okay, I will say it is okay to resume driving because I will presume it was fainting and not a seizure.

## 2022-06-21 ENCOUNTER — Other Ambulatory Visit: Payer: HMO

## 2022-06-21 ENCOUNTER — Ambulatory Visit: Payer: HMO

## 2022-06-21 DIAGNOSIS — R55 Syncope and collapse: Secondary | ICD-10-CM

## 2022-06-21 DIAGNOSIS — R9431 Abnormal electrocardiogram [ECG] [EKG]: Secondary | ICD-10-CM

## 2022-06-27 ENCOUNTER — Ambulatory Visit: Payer: HMO

## 2022-06-27 DIAGNOSIS — R55 Syncope and collapse: Secondary | ICD-10-CM

## 2022-06-28 ENCOUNTER — Ambulatory Visit: Payer: HMO | Admitting: Neurology

## 2022-06-28 DIAGNOSIS — R55 Syncope and collapse: Secondary | ICD-10-CM | POA: Diagnosis not present

## 2022-06-28 NOTE — Progress Notes (Unsigned)
EEG complete - results pending 

## 2022-06-29 NOTE — Procedures (Signed)
ELECTROENCEPHALOGRAM REPORT  Date of Study: 06/29/2022  Patient's Name: NICKISHA HUM MRN: [id number] Date of Birth: 1946-03-27   Clinical History: 76 year old female with HTN and thyroid disease who presents for syncope.  Medications: Venlafaxine Levothyroxine Amlodipine Losartan Atorvastatin Aspirin Flonase Levocetirizine   Technical Summary: A multichannel digital EEG recording measured by the international 10-20 system with electrodes applied with paste and impedances below 5000 ohms performed in our laboratory with EKG monitoring in an awake patient.  Photic stimulation was performed.  Hyperventilation was not performed due to age.  The digital EEG was referentially recorded, reformatted, and digitally filtered in a variety of bipolar and referential montages for optimal display.    Description: The patient is awake during the recording.  During maximal wakefulness, there is a symmetric, medium voltage 9-10 Hz posterior dominant rhythm that attenuates with eye opening.  The record is symmetric.  Stage 2 sleep was not seen.  Photic stimulation did not elicit any abnormalities.  There were no epileptiform discharges or electrographic seizures seen.    EKG lead was unremarkable.  Impression: This awake EEG is normal.    Clinical Correlation: A normal EEG does not exclude a clinical diagnosis of epilepsy.  If further clinical questions remain, prolonged EEG may be helpful.  Clinical correlation is advised.   Shon Millet, DO

## 2022-07-01 ENCOUNTER — Telehealth: Payer: Self-pay

## 2022-07-01 NOTE — Progress Notes (Signed)
Called patient, NA, LMAM

## 2022-07-01 NOTE — Telephone Encounter (Signed)
LMOVM

## 2022-07-01 NOTE — Progress Notes (Signed)
Called patient, Tabitha Weaver, Tabitha Weaver

## 2022-07-01 NOTE — Telephone Encounter (Signed)
-----   Message from [place], [place] sent at 06/30/2022  7:32 PM EST ----- Left ventricular ejection fraction (pumping activity) of the heart is within the normal limit.  No significant valvular heart disease. Details will be reviewed at the next office visit.  Sunit Pecos, DO, [facility]

## 2022-07-04 NOTE — Progress Notes (Signed)
S/w patient.

## 2022-07-04 NOTE — Progress Notes (Signed)
07/14/22

## 2022-07-12 NOTE — Progress Notes (Signed)
Called patient, Na, LMAM.

## 2022-07-12 NOTE — Progress Notes (Signed)
Patient called back, I have discussed results with her. Patient confirmed appointment for 07/14/22

## 2022-07-14 ENCOUNTER — Ambulatory Visit: Payer: HMO | Admitting: Cardiology

## 2022-07-14 ENCOUNTER — Encounter: Payer: Self-pay | Admitting: Cardiology

## 2022-07-14 VITALS — BP 133/68 | HR 84 | Resp 16 | Ht 67.0 in | Wt 191.6 lb

## 2022-07-14 DIAGNOSIS — I10 Essential (primary) hypertension: Secondary | ICD-10-CM

## 2022-07-14 DIAGNOSIS — E1165 Type 2 diabetes mellitus with hyperglycemia: Secondary | ICD-10-CM

## 2022-07-14 DIAGNOSIS — F17200 Nicotine dependence, unspecified, uncomplicated: Secondary | ICD-10-CM

## 2022-07-14 DIAGNOSIS — R9431 Abnormal electrocardiogram [ECG] [EKG]: Secondary | ICD-10-CM

## 2022-07-14 DIAGNOSIS — E782 Mixed hyperlipidemia: Secondary | ICD-10-CM

## 2022-07-14 DIAGNOSIS — R55 Syncope and collapse: Secondary | ICD-10-CM

## 2022-07-14 NOTE — Progress Notes (Signed)
ID:  Tabitha Weaver, DOB 1946/01/21, MRN [id number]  PCP:  Alysia Penna, MD  Cardiologist:  Tessa Lerner, DO, [facility] (established care 05/25/2022)  Date: 07/14/22 Last Office Visit: 05/25/2022  Chief Complaint  Patient presents with   Follow-up    7 week- syncope    HPI  Tabitha Weaver is a 76 y.o. African-American female whose past medical history and cardiovascular risk factors include: Hypothyroidism, diabetes mellitus type 2 with nephropathy, hypertension, anemia, hyperlipidemia, nicotine dependence, postmenopausal female.  Patient is accompanied by her granddaughter Tabitha Weaver at today's office visit.  Patient provides verbal consent with having Tabitha Weaver present during today's encounter.  Patient was referred to the practice for evaluation and management of syncope.  Based on the course of events described during initial consultation the underlying etiology of the syncope was likely vasovagal precipitated by dehydration, EtOH use, and vasodilator therapy.  EKG noted sinus rhythm with ST-T changes in the high lateral leads.  The shared decision was to check TSH, cardiac monitor, echo, and stress test.  She was also advised not to drive for at least 6 months until she is symptom-free of syncope.  She is also recommended to discuss being evaluated by neurology given the recent course of events.  Since last office visit, she is doing well from a cardiovascular standpoint.  No recurrence of syncope.  She is also been evaluated by neurology.  Progress note reviewed.  ALLERGIES: No Known Allergies  MEDICATION LIST PRIOR TO VISIT: Current Meds  Medication Sig   amLODipine (NORVASC) 2.5 MG tablet Take 1 tablet (2.5 mg total) by mouth daily. (Patient taking differently: Take 5 mg by mouth daily.)   aspirin EC 81 MG tablet Take 81 mg by mouth daily.   atorvastatin (LIPITOR) 20 MG tablet Take 20 mg by mouth daily.   Ergocalciferol (VITAMIN D2 PO) Take by mouth.   fluticasone (FLONASE) 50  MCG/ACT nasal spray fluticasone propionate 50 mcg/actuation nasal spray,suspension   Lancets (ONETOUCH DELICA PLUS LANCET30G) MISC SMARTSIG:Via Meter   levothyroxine (SYNTHROID) 100 MCG tablet Take 1 tablet by mouth daily.   losartan (COZAAR) 100 MG tablet Take 100 mg by mouth daily.   metFORMIN (GLUCOPHAGE) 500 MG tablet Take by mouth once.   Omega 3-6-9 Fatty Acids (OMEGA 3-6-9 COMPLEX) CAPS Take 3-6.9 mg by mouth daily.   venlafaxine XR (EFFEXOR-XR) 37.5 MG 24 hr capsule Take 37.5 mg by mouth daily.     PAST MEDICAL HISTORY: Past Medical History:  Diagnosis Date   High cholesterol    Hypertension    Osteoporosis    Thyroid disease     PAST SURGICAL HISTORY: Past Surgical History:  Procedure Laterality Date   ABDOMINAL HYSTERECTOMY  1985   ROTATOR CUFF REPAIR Right 09/2013, 1995    FAMILY HISTORY: The patient family history includes Heart attack in her father and mother; Thyroid disease in her daughter.  SOCIAL HISTORY:  The patient  reports that she has been smoking cigarettes. She has been smoking an average of .25 packs per day. She has never used smokeless tobacco. She reports current alcohol use of about 4.0 standard drinks of alcohol per week. She reports that she does not use drugs.  REVIEW OF SYSTEMS: Review of Systems  Cardiovascular:  Positive for syncope (Last episode May 21, 2022.). Negative for chest pain, claudication, dyspnea on exertion, irregular heartbeat, leg swelling, near-syncope, orthopnea, palpitations and paroxysmal nocturnal dyspnea.  Respiratory:  Negative for shortness of breath.   Hematologic/Lymphatic: Negative for bleeding problem.  Musculoskeletal:  Negative for muscle cramps and myalgias.  Neurological:  Negative for dizziness and light-headedness.    PHYSICAL EXAM:    07/14/2022   10:25 AM 06/10/2022   10:04 AM 05/25/2022    1:52 PM  Vitals with BMI  Height 5\' 7"  5\' 7"  5\' 7"   Weight 191 lbs 10 oz 188 lbs 187 lbs 2 oz  BMI 30 29.44  29.3  Systolic 133 138  Diastolic 68 73 75  Pulse 84 73 68    Physical Exam  Constitutional: No distress.  Appears older than stated age, hemodynamically stable.   Neck: No JVD present.  Cardiovascular: Normal rate, regular rhythm, S1 normal, S2 normal, intact distal pulses and normal pulses. Exam reveals no gallop, no S3 and no S4.  Pulmonary/Chest: Effort normal and breath sounds normal. No stridor. She has no wheezes. She has no rales.  Abdominal: Soft. Bowel sounds are normal. She exhibits no distension. There is no abdominal tenderness.  Musculoskeletal:        General: No edema.     Cervical back: Neck supple.  Neurological: She is alert and oriented to person, place, and time. She has intact cranial nerves (2-12).  Skin: Skin is warm and moist.   CARDIAC DATABASE: EKG: 05/25/2022: Normal sinus rhythm, 68 bpm, first-degree AV block, occasional PACs, ST-T changes in the high lateral leads consider ischemia.  Echocardiogram: 06/27/2022: Left ventricle cavity is normal in size. Moderate concentric hypertrophy of the left ventricle. Normal global wall motion. Normal LV systolic function with EF 70%. Doppler evidence of grade I (impaired) diastolic dysfunction, normal LAP. No significant valvular abnormality.  Normal right atrial pressure.     Stress Testing: Exercise nuclear stress test 06/21/2022: Breast tissue attenuation noted in the septal region.  Mild degree medium extent fixed perfusion defect located in the apical septal wall and mid inferoseptal wall. There is no reversible defect.  Overall LV systolic function is normal without regional wall motion abnormalities. Stress LV EF: 63%.  Normal ECG stress. The patient exercised for 4 minutes and 33 seconds of a Bruce protocol, achieving approximately 6.5 METs and 86% MPHR. Stress terminated due to fatigue.  Peak EKG/ECG revealed 1 mm downsloping ST depression of the anterolateral leads which normalized at <1 minute into  recovery. There were occasional PVC and PAC noted. The blood pressure response was normal. No previous exam available for comparison. Low risk study.    Heart Catheterization: None  Mobile cardiac telemetry (Zio Patch): 06/02/2022 - 06/16/2022 Dominant rhythm sinus. Heart rate 30-150 bpm. Avg HR 80 bpm. Minimum heart rate, 30 bpm, sinus mechanism with second-degree type I AV block No atrial fibrillation, high grade AV block, pauses (3 seconds or longer). Total ventricular ectopic burden <1%. 1 episode, asymptomatic, NSVT, 6 beats, 3.6 seconds duration, max HR 119 bpm, average HR 100 bpm. Total supraventricular ectopic burden <1%. Patient triggered events: 0.   LABORATORY DATA:    Latest Ref Rng & Units 05/21/2022    9:53 PM 04/13/2021    2:59 PM  CBC  WBC 4.0 - 10.5 K/uL 9.3  8.8   Hemoglobin 12.0 - 15.0 g/dL 06/18/2022  05/23/2022   Hematocrit 36.0 - 46.0 % 41.1  48.2   Platelets 150 - 400 K/uL 220  214        Latest Ref Rng & Units 05/21/2022    9:53 PM 04/13/2021    2:59 PM  CMP  Glucose 70 - 99 mg/dL 03.0  93   BUN 8 -  23 mg/dL 13  13   Creatinine 4.97 - 1.00 mg/dL 0.26  3.78   Sodium 588 - 145 mmol/L 143  140   Potassium 3.5 - 5.1 mmol/L 3.8  4.1   Chloride 98 - 111 mmol/L 111  104   CO2 22 - 32 mmol/L 22  27   Calcium 8.9 - 10.3 mg/dL 9.1  9.6   Total Protein 6.5 - 8.1 g/dL 6.3  7.2   Total Bilirubin 0.3 - 1.2 mg/dL 0.4  0.6   Alkaline Phos 38 - 126 U/L 62  70   AST 15 - 41 U/L 15  18   ALT 0 - 44 U/L 15  20     Lipid Panel  No results found for: "CHOL", "TRIG", "HDL", "CHOLHDL", "VLDL", "LDLCALC", "LDLDIRECT", "LABVLDL"  No components found for: "NTPROBNP" No results for input(s): "PROBNP" in the last 8760 hours. Recent Labs    09/09/21 1438  TSH 1.74    BMP Recent Labs    05/21/22 2153  NA 143  K 3.8  CL 111  CO2 22  GLUCOSE 134*  BUN 13  CREATININE 0.82  CALCIUM 9.1  GFRNONAA >60    HEMOGLOBIN A1C No results found for: "HGBA1C",  "MPG"  IMPRESSION:    ICD-10-CM   1. Syncope and collapse  R55     2. Abnormal electrocardiogram  R94.31     3. Type 2 diabetes mellitus with hyperglycemia, without long-term current use of insulin (HCC)  E11.65     4. Benign hypertension  I10     5. Mixed hyperlipidemia  E78.2     6. Smoking  F17.200        RECOMMENDATIONS: SHAYANNA THATCH is a 76 y.o. African-American female whose past medical history and cardiac risk factors include: Hypothyroidism, diabetes mellitus type 2 with nephropathy, hypertension, anemia, hyperlipidemia, nicotine dependence, postmenopausal female.  Syncope and collapse Multifactorial: Likely vasovagal, secondary to EtOH, dehydration,  Orthostatic vital signs negative - last visit. EKG shows sinus rhythm with ST-T changes in the high lateral leads cannot rule out ischemia.  No prior ECGs available for comparison. High sensitive troponins negative x1. EtOH 46 mg/dL (normal levels <50 mg/dL).  Echo: LVEF 70%, grade 1 diastolic dysfunction, no significant valve disease. Exercise nuclear stress test: Low risk study with fixed perfusion defect. Cardiac telemetry: Average heart rate 80 bpm without any significant dysrhythmias. She is aware of [place] driving laws - to stop driving after an episode of loss of consciousness until 6 months event-free.  Abnormal electrocardiogram Noted to have ST-T changes in the high lateral leads concerning for possible anterolateral ischemia. No prior cardiovascular work-up or ECGs available for comparison. In addition, on the cardiac telemetry she was noted to have episode of asymptomatic NSVT. Exercise nuclear stress test: Low risk with fixed perfusion defect. No additional workup warranted at this time  Type 2 diabetes mellitus with hyperglycemia, without long-term current use of insulin (HCC) Reemphasized importance of glycemic control. Currently on atorvastatin, losartan  Benign hypertension Office blood  pressures are well controlled. Currently on amlodipine and losartan. Orthostatic vital signs were negative at prior office visits. Currently managed by primary care provider.  Mixed hyperlipidemia Currently on statin therapy. Currently managed by primary care provider.  Smoking Tobacco cessation counseling: Currently smoking 3 cigarettes/day Patient is willing to quit at this time - started on patches. 7 mins were spent counseling patient cessation techniques. We discussed various methods to help quit smoking, including deciding on a date  to quit, joining a support group, pharmacological agents- nicotine gum/patch/lozenges.  I will reassess her progress at the next follow-up visit  No additional cardiovascular testing needed at this time.  Recommended follow-up on as-needed basis or annually given her age and risk factors.  Patient prefers to follow-up annually.  FINAL MEDICATION LIST END OF ENCOUNTER: No orders of the defined types were placed in this encounter.   Medications Discontinued During This Encounter  Medication Reason   levocetirizine (XYZAL) 5 MG tablet No longer needed (for PRN medications)     Current Outpatient Medications:    amLODipine (NORVASC) 2.5 MG tablet, Take 1 tablet (2.5 mg total) by mouth daily. (Patient taking differently: Take 5 mg by mouth daily.), Disp: 30 tablet, Rfl: 2   aspirin EC 81 MG tablet, Take 81 mg by mouth daily., Disp: , Rfl:    atorvastatin (LIPITOR) 20 MG tablet, Take 20 mg by mouth daily., Disp: , Rfl:    Ergocalciferol (VITAMIN D2 PO), Take by mouth., Disp: , Rfl:    fluticasone (FLONASE) 50 MCG/ACT nasal spray, fluticasone propionate 50 mcg/actuation nasal spray,suspension, Disp: , Rfl:    Lancets (ONETOUCH DELICA PLUS LANCET30G) MISC, SMARTSIG:Via Meter, Disp: , Rfl:    levothyroxine (SYNTHROID) 100 MCG tablet, Take 1 tablet by mouth daily., Disp: , Rfl:    losartan (COZAAR) 100 MG tablet, Take 100 mg by mouth daily., Disp: , Rfl:     metFORMIN (GLUCOPHAGE) 500 MG tablet, Take by mouth once., Disp: , Rfl:    Omega 3-6-9 Fatty Acids (OMEGA 3-6-9 COMPLEX) CAPS, Take 3-6.9 mg by mouth daily., Disp: , Rfl:    venlafaxine XR (EFFEXOR-XR) 37.5 MG 24 hr capsule, Take 37.5 mg by mouth daily., Disp: , Rfl:   No orders of the defined types were placed in this encounter.   There are no Patient Instructions on file for this visit.   --Continue cardiac medications as reconciled in final medication list. --Return in about 1 year (around 07/15/2023) for Annual follow up visit. or sooner if needed. --Continue follow-up with your primary care physician regarding the management of your other chronic comorbid conditions.  Patient's questions and concerns were addressed to her satisfaction. She voices understanding of the instructions provided during this encounter.   This note was created using a voice recognition software as a result there may be grammatical errors inadvertently enclosed that do not reflect the nature of this encounter. Every attempt is made to correct such errors.  Tessa LernerSunit Eyvonne Burchfield, [place]DO, [facility]cy[facility]ACC  Pager: [phone] Office: [phone]

## 2022-09-14 DIAGNOSIS — R350 Frequency of micturition: Secondary | ICD-10-CM | POA: Diagnosis not present

## 2022-09-14 DIAGNOSIS — N760 Acute vaginitis: Secondary | ICD-10-CM | POA: Diagnosis not present

## 2022-09-22 IMAGING — CT CT ANGIO NECK
2 of 7 series · 8 of 33 positions shown · IV contrast (omnipaque)
Comparison: No prior CTA, correlation is made with CT head
04/13/2021

CLINICAL DATA: Stroke/TIA, assess arteries

EXAM:
CT ANGIOGRAPHY HEAD AND NECK
TECHNIQUE: Multidetector CT imaging of the head and neck was performed using
the standard protocol during bolus administration of intravenous
contrast. Multiplanar CT image reconstructions and MIPs were
obtained to evaluate the vascular anatomy. Carotid stenosis
measurements (when applicable) are obtained utilizing NASCET
criteria, using the distal internal carotid diameter as the
denominator.
CONTRAST:  75mL OMNIPAQUE IOHEXOL 350 MG/ML SOLN

[Series 5: cta head neck · axial · 0.49mm/px · z∈[-226,-110]mm · 2 of 176 slices shown]
[im 59/176  soft-tissue]
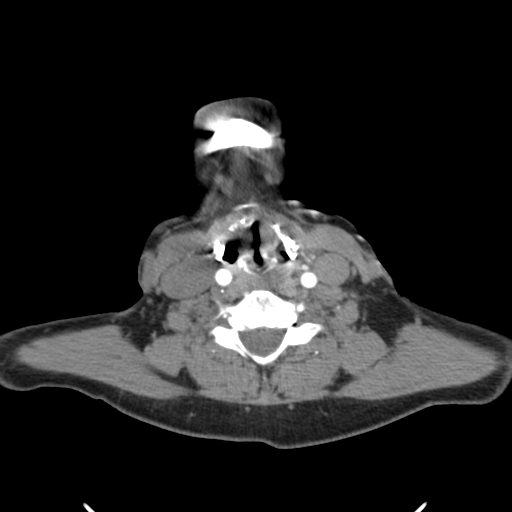
[im 117/176  soft-tissue]
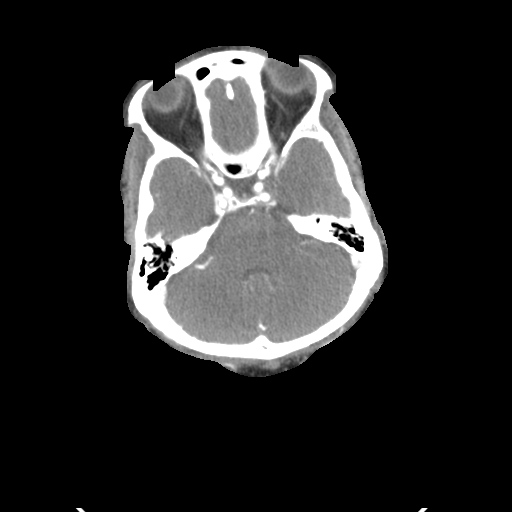

[Series 7: ax thin · axial · 0.49mm/px · z∈[-292,-42]mm · 6 of 350 slices shown]
[im 50/350  soft-tissue]
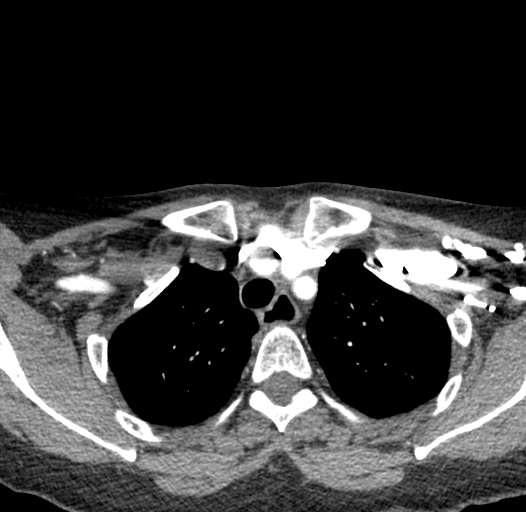
[im 100/350  bone]
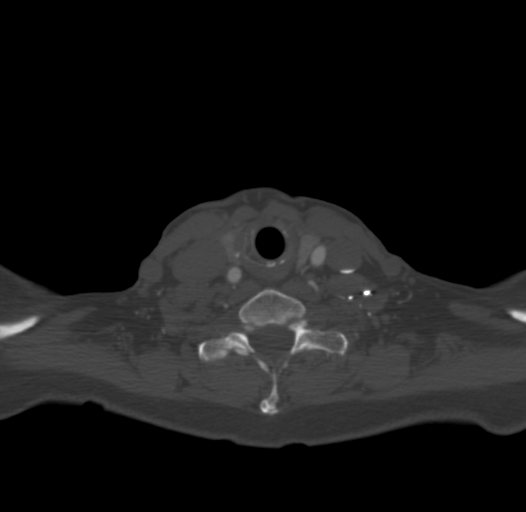
[im 150/350  soft-tissue]
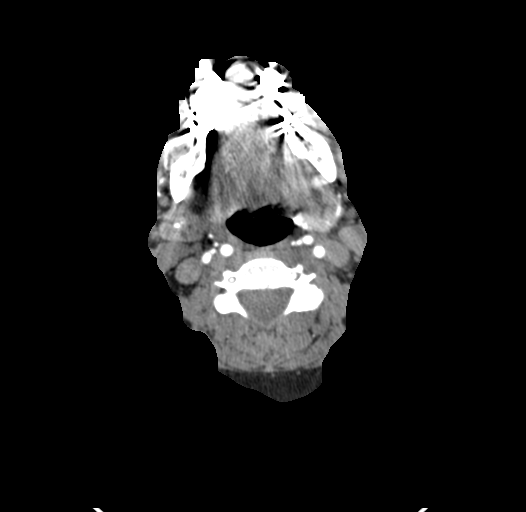
[im 200/350  bone]
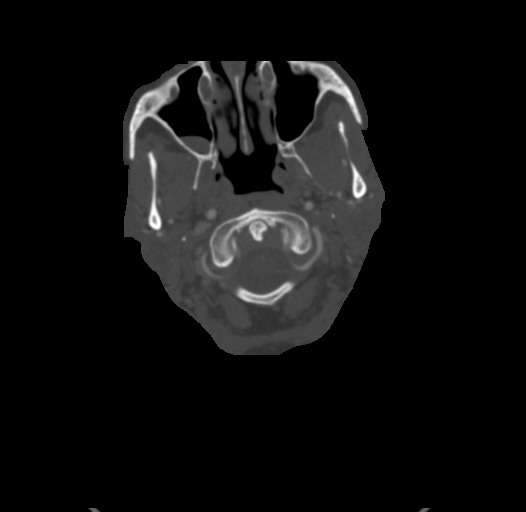
[im 250/350  soft-tissue]
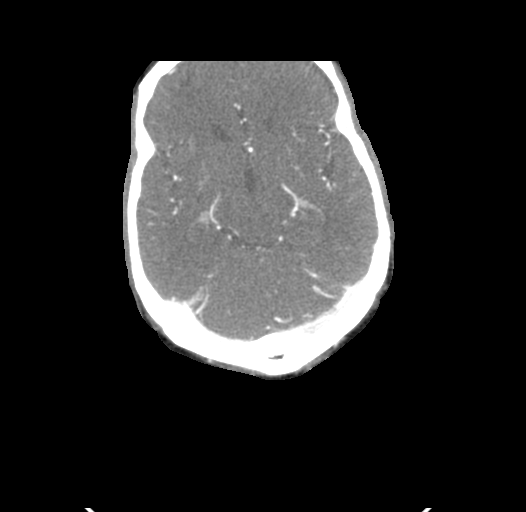
[im 300/350  bone]
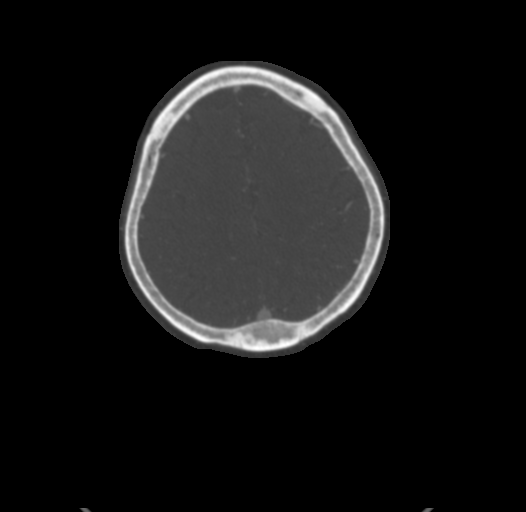

[8 of 33 positions shown; findings below may reference images not displayed]

FINDINGS: CT HEAD FINDINGS

Brain: Right frontal encephalomalacia. No acute infarct, hemorrhage,
mass, mass effect, midline shift. Ventricles are normal for age,
with ex vacuo dilatation of the right frontal horn.

Skull: Normal. Negative for fracture or focal lesion.

Sinuses: Mucous retention cyst in the right maxillary sinus.

Orbits: No acute finding.

Review of the MIP images confirms the above findings

CTA NECK FINDINGS

Aortic arch: Standard branching. Imaged portion shows no evidence of
aneurysm or dissection. No significant stenosis of the major arch
vessel origins.

Right carotid system: No evidence of dissection, stenosis (50% or
greater), or occlusion.

Left carotid system: No evidence of dissection, stenosis (50% or
greater), or occlusion.

Vertebral arteries: Diminutive right vertebral artery, which
terminates in PICA. Normal left vertebral artery. No evidence of
dissection, stenosis (50% or greater) or occlusion.

Skeleton: Straightening and mild reversal of the normal cervical
lordosis. No acute osseous abnormality.

Other neck: Mildly dilated ducts in the submandibular glands, which
are otherwise unremarkable. No stone.

Upper chest: Linear scarring along the posterior left upper lobe. No
focal pulmonary opacity.

Review of the MIP images confirms the above findings

CTA HEAD FINDINGS

Anterior circulation: Both internal carotid arteries are widely
patent to the termini, without stenosis or other abnormality.
Scattered carotid siphon calcifications. A1 segments patent. Normal
anterior communicating artery. Anterior cerebral arteries are widely
patent to their distal aspects. No M1 stenosis or occlusion. Normal
MCA bifurcations. Distal MCA branches well perfused and symmetric.

Posterior circulation: Vertebral arteries are patent. The right
vertebral artery terminates in PICA. The basilar artery is
diminutive but patent. Posterior inferior cerebral arteries patent
bilaterally. Basilar patent to its distal aspect. Superior cerebral
arteries patent bilaterally. PCAs well perfused to their distal
aspects without stenosis, primarily from prominent bilateral
posterior communicating arteries.

Venous sinuses: As permitted by contrast timing, patent.

Anatomic variants: None significant

Review of the MIP images confirms the above findings
IMPRESSION: 1. No large vessel occlusion.
2. No hemodynamically significant stenosis in the neck.

## 2023-01-06 DIAGNOSIS — R7989 Other specified abnormal findings of blood chemistry: Secondary | ICD-10-CM | POA: Diagnosis not present

## 2023-01-06 DIAGNOSIS — E559 Vitamin D deficiency, unspecified: Secondary | ICD-10-CM | POA: Diagnosis not present

## 2023-01-06 DIAGNOSIS — D649 Anemia, unspecified: Secondary | ICD-10-CM | POA: Diagnosis not present

## 2023-01-06 DIAGNOSIS — E785 Hyperlipidemia, unspecified: Secondary | ICD-10-CM | POA: Diagnosis not present

## 2023-01-06 DIAGNOSIS — E039 Hypothyroidism, unspecified: Secondary | ICD-10-CM | POA: Diagnosis not present

## 2023-01-06 DIAGNOSIS — I10 Essential (primary) hypertension: Secondary | ICD-10-CM | POA: Diagnosis not present

## 2023-01-06 DIAGNOSIS — E1121 Type 2 diabetes mellitus with diabetic nephropathy: Secondary | ICD-10-CM | POA: Diagnosis not present

## 2023-01-13 DIAGNOSIS — I10 Essential (primary) hypertension: Secondary | ICD-10-CM | POA: Diagnosis not present

## 2023-01-13 DIAGNOSIS — E039 Hypothyroidism, unspecified: Secondary | ICD-10-CM | POA: Diagnosis not present

## 2023-01-13 DIAGNOSIS — E785 Hyperlipidemia, unspecified: Secondary | ICD-10-CM | POA: Diagnosis not present

## 2023-01-13 DIAGNOSIS — F17200 Nicotine dependence, unspecified, uncomplicated: Secondary | ICD-10-CM | POA: Diagnosis not present

## 2023-01-13 DIAGNOSIS — R82998 Other abnormal findings in urine: Secondary | ICD-10-CM | POA: Diagnosis not present

## 2023-01-13 DIAGNOSIS — Z0000 Encounter for general adult medical examination without abnormal findings: Secondary | ICD-10-CM | POA: Diagnosis not present

## 2023-01-13 DIAGNOSIS — R55 Syncope and collapse: Secondary | ICD-10-CM | POA: Diagnosis not present

## 2023-01-13 DIAGNOSIS — Z1339 Encounter for screening examination for other mental health and behavioral disorders: Secondary | ICD-10-CM | POA: Diagnosis not present

## 2023-01-13 DIAGNOSIS — Z1331 Encounter for screening for depression: Secondary | ICD-10-CM | POA: Diagnosis not present

## 2023-01-13 DIAGNOSIS — D649 Anemia, unspecified: Secondary | ICD-10-CM | POA: Diagnosis not present

## 2023-01-13 DIAGNOSIS — E1121 Type 2 diabetes mellitus with diabetic nephropathy: Secondary | ICD-10-CM | POA: Diagnosis not present

## 2023-01-13 DIAGNOSIS — R232 Flushing: Secondary | ICD-10-CM | POA: Diagnosis not present

## 2023-01-13 DIAGNOSIS — E669 Obesity, unspecified: Secondary | ICD-10-CM | POA: Diagnosis not present

## 2023-01-16 DIAGNOSIS — R61 Generalized hyperhidrosis: Secondary | ICD-10-CM | POA: Diagnosis not present

## 2023-01-16 DIAGNOSIS — R350 Frequency of micturition: Secondary | ICD-10-CM | POA: Diagnosis not present

## 2023-03-15 DIAGNOSIS — E119 Type 2 diabetes mellitus without complications: Secondary | ICD-10-CM | POA: Diagnosis not present

## 2023-03-15 DIAGNOSIS — H40033 Anatomical narrow angle, bilateral: Secondary | ICD-10-CM | POA: Diagnosis not present

## 2023-03-17 DIAGNOSIS — H43393 Other vitreous opacities, bilateral: Secondary | ICD-10-CM | POA: Diagnosis not present

## 2023-07-05 DIAGNOSIS — R32 Unspecified urinary incontinence: Secondary | ICD-10-CM | POA: Diagnosis not present

## 2023-07-05 DIAGNOSIS — E785 Hyperlipidemia, unspecified: Secondary | ICD-10-CM | POA: Diagnosis not present

## 2023-07-05 DIAGNOSIS — I10 Essential (primary) hypertension: Secondary | ICD-10-CM | POA: Diagnosis not present

## 2023-07-05 DIAGNOSIS — R232 Flushing: Secondary | ICD-10-CM | POA: Diagnosis not present

## 2023-07-05 DIAGNOSIS — E669 Obesity, unspecified: Secondary | ICD-10-CM | POA: Diagnosis not present

## 2023-07-05 DIAGNOSIS — F17200 Nicotine dependence, unspecified, uncomplicated: Secondary | ICD-10-CM | POA: Diagnosis not present

## 2023-07-05 DIAGNOSIS — D649 Anemia, unspecified: Secondary | ICD-10-CM | POA: Diagnosis not present

## 2023-07-05 DIAGNOSIS — M25562 Pain in left knee: Secondary | ICD-10-CM | POA: Diagnosis not present

## 2023-07-05 DIAGNOSIS — E1121 Type 2 diabetes mellitus with diabetic nephropathy: Secondary | ICD-10-CM | POA: Diagnosis not present

## 2023-07-05 DIAGNOSIS — E039 Hypothyroidism, unspecified: Secondary | ICD-10-CM | POA: Diagnosis not present

## 2023-07-10 DIAGNOSIS — N3281 Overactive bladder: Secondary | ICD-10-CM | POA: Diagnosis not present

## 2023-07-10 DIAGNOSIS — E8941 Symptomatic postprocedural ovarian failure: Secondary | ICD-10-CM | POA: Diagnosis not present

## 2023-07-10 DIAGNOSIS — Z6831 Body mass index (BMI) 31.0-31.9, adult: Secondary | ICD-10-CM | POA: Diagnosis not present

## 2023-07-10 DIAGNOSIS — M8588 Other specified disorders of bone density and structure, other site: Secondary | ICD-10-CM | POA: Diagnosis not present

## 2023-07-10 DIAGNOSIS — Z1231 Encounter for screening mammogram for malignant neoplasm of breast: Secondary | ICD-10-CM | POA: Diagnosis not present

## 2023-07-10 DIAGNOSIS — R87615 Unsatisfactory cytologic smear of cervix: Secondary | ICD-10-CM | POA: Diagnosis not present

## 2023-07-10 DIAGNOSIS — Z1151 Encounter for screening for human papillomavirus (HPV): Secondary | ICD-10-CM | POA: Diagnosis not present

## 2023-07-10 DIAGNOSIS — Z124 Encounter for screening for malignant neoplasm of cervix: Secondary | ICD-10-CM | POA: Diagnosis not present

## 2023-07-10 DIAGNOSIS — N958 Other specified menopausal and perimenopausal disorders: Secondary | ICD-10-CM | POA: Diagnosis not present

## 2024-01-15 DIAGNOSIS — N3281 Overactive bladder: Secondary | ICD-10-CM | POA: Diagnosis not present

## 2024-01-15 DIAGNOSIS — R87625 Unsatisfactory cytologic smear of vagina: Secondary | ICD-10-CM | POA: Diagnosis not present

## 2024-01-15 DIAGNOSIS — R87615 Unsatisfactory cytologic smear of cervix: Secondary | ICD-10-CM | POA: Diagnosis not present

## 2024-01-23 DIAGNOSIS — E039 Hypothyroidism, unspecified: Secondary | ICD-10-CM | POA: Diagnosis not present

## 2024-01-23 DIAGNOSIS — E785 Hyperlipidemia, unspecified: Secondary | ICD-10-CM | POA: Diagnosis not present

## 2024-01-23 DIAGNOSIS — E7849 Other hyperlipidemia: Secondary | ICD-10-CM | POA: Diagnosis not present

## 2024-01-23 DIAGNOSIS — M810 Age-related osteoporosis without current pathological fracture: Secondary | ICD-10-CM | POA: Diagnosis not present

## 2024-01-23 DIAGNOSIS — I10 Essential (primary) hypertension: Secondary | ICD-10-CM | POA: Diagnosis not present

## 2024-01-23 DIAGNOSIS — E1121 Type 2 diabetes mellitus with diabetic nephropathy: Secondary | ICD-10-CM | POA: Diagnosis not present

## 2024-01-23 DIAGNOSIS — D649 Anemia, unspecified: Secondary | ICD-10-CM | POA: Diagnosis not present

## 2024-01-26 ENCOUNTER — Ambulatory Visit (INDEPENDENT_AMBULATORY_CARE_PROVIDER_SITE_OTHER)

## 2024-01-26 ENCOUNTER — Ambulatory Visit (INDEPENDENT_AMBULATORY_CARE_PROVIDER_SITE_OTHER): Admitting: Podiatry

## 2024-01-26 DIAGNOSIS — M7751 Other enthesopathy of right foot: Secondary | ICD-10-CM | POA: Diagnosis not present

## 2024-01-26 DIAGNOSIS — M7731 Calcaneal spur, right foot: Secondary | ICD-10-CM | POA: Diagnosis not present

## 2024-01-26 DIAGNOSIS — R0989 Other specified symptoms and signs involving the circulatory and respiratory systems: Secondary | ICD-10-CM

## 2024-01-26 DIAGNOSIS — L84 Corns and callosities: Secondary | ICD-10-CM | POA: Diagnosis not present

## 2024-01-26 DIAGNOSIS — E1149 Type 2 diabetes mellitus with other diabetic neurological complication: Secondary | ICD-10-CM

## 2024-01-26 DIAGNOSIS — M2041 Other hammer toe(s) (acquired), right foot: Secondary | ICD-10-CM

## 2024-01-26 DIAGNOSIS — M2042 Other hammer toe(s) (acquired), left foot: Secondary | ICD-10-CM

## 2024-01-26 DIAGNOSIS — M7732 Calcaneal spur, left foot: Secondary | ICD-10-CM

## 2024-01-26 DIAGNOSIS — M7752 Other enthesopathy of left foot: Secondary | ICD-10-CM | POA: Diagnosis not present

## 2024-01-26 NOTE — Progress Notes (Signed)
 Subjective:  Patient ID: Tabitha Weaver, female    DOB: 04/05/1946,  MRN: [id number]  Chief Complaint  Patient presents with   Foot Pain    RM#13 Bilateral foot pain swelling and cold sensation cramping ongoing for 2-3 months.    Discussed the use of AI scribe software for clinical note transcription with the patient, who gave verbal consent to proceed.  History of Present Illness Tabitha Weaver is a 78 year old female with diabetes who presents with burning and stinging sensations on the tops of her feet.  For the past two months, she experiences burning and stinging sensations on the tops of her feet, alternating between hot and cold. Puffiness is noted on the right foot. No injuries or wounds are present. Cold sensations disturb her sleep, leading her to wear socks at night.  She maintains an active lifestyle, walking for about an hour every morning and cutting grass. The sensations do not hinder her walking. Her blood sugar levels are stable, with a recent reading of 154 mg/dL. She is unsure of her last A1c value. There is no radiation of sensations up the leg and no pain with walking or pressure on her feet or legs.      Objective:    Physical Exam General: AAO x3, NAD  Dermatological: Hyperkeratotic lesion noted along the medial aspect of the right hallux without any underlying ulceration.  There is no open lesions identified bilaterally.  Vascular: Dorsalis Pedis artery and Posterior Tibial artery pedal pulses are 2/4 bilateral with immedate capillary fill time.  There is no pain with calf compression, swelling, warmth, erythema.   Neruologic: Sensation mildly decreased with Semmes Weinstein monofilament  Musculoskeletal: Unable to appreciate any area pinpoint tenderness. There is no significant edema noted. Flexor and extensor tendons intact. MMT 5/5.   Gait: Unassisted, Nonantalgic.     No images are attached to the encounter.    Results Procedure: Callus  Trimming Description: Trimmed callus on the side of the hallux  LABS Blood Glucose: 154 mg/dL (93/72/7974)  RADIOLOGY Foot X-ray: Mild hallux valgus, calcaneal spur. Hammertoes present.  No evidence of acute fracture.   Assessment:   1. Heel spur, right   2. Heel spur, left   3. Decreased pedal pulses   4. Type II diabetes mellitus with neurological manifestations (HCC)   5. Hammertoes of both feet   6. Callus      Plan:  Patient was evaluated and treated and all questions answered.  Assessment and Plan Assessment & Plan Diabetic neuropathy Burning, stinging, and temperature sensations suggest diabetic neuropathy. Differential includes circulation issues, but neuropathy is more likely. - Provided list of over-the-counter creams for symptom relief. Discussed gabapentin  but she wants to hold off on that for now.  - Ordered circulation test to assess blood flow to the feet. ABI ordered  - Provided exercises for foot care. - Discussed gabapentin  as a potential treatment if creams are ineffective.  Type 2 diabetes mellitus Blood sugar at 154 mg/dL. Diabetes may contribute to neuropathy. - I do think she will benefit from diabetic shoes and offloading given her digit deformity, callus formation to help prevent ulceration.  Order to be faxed to [facility] - Discussed daily foot inspection   Heel spur Heel pain resolved. X-ray shows heel spur, but no current pain. - Advised on good arch support and staying active. - Provided worksheet with exercises to stretch before and after exercise.   Return in about 3 months (around 04/27/2024).  Donnice JONELLE Fees DPM

## 2024-01-26 NOTE — Patient Instructions (Addendum)
 For topical creams I like CAPSAICIN CREAM, ASPERCREME WITH LIDOCAINE, VOLTAREN  GEL  Neuropathic Pain Neuropathic pain is pain caused by damage to the nerves that are responsible for certain sensations in your body (sensory nerves). Neuropathic pain can make you more sensitive to pain. Even a minor sensation can feel very painful. This is usually a long-term (chronic) condition that can be difficult to treat. The type of pain differs from person to person. It may: Start suddenly (acute), or it may develop slowly and become chronic. Come and go as damaged nerves heal, or it may stay at the same level for years. Cause emotional distress, loss of sleep, and a lower quality of life. What are the causes? The most common cause of this condition is diabetes. Many other diseases and conditions can also cause neuropathic pain. Causes of neuropathic pain can be classified as: Toxic. This is caused by medicines and chemicals. The most common causes of toxic neuropathic pain is damage from medicines that kill cancer cells (chemotherapy) or alcohol  abuse. Metabolic. This can be caused by: Diabetes. Lack of vitamins like B12. Traumatic. Any injury that cuts, crushes, or stretches a nerve can cause damage and pain. Compression-related. If a sensory nerve gets trapped or compressed for a long period of time, the blood supply to the nerve can be cut off. Vascular. Many blood vessel diseases can cause neuropathic pain by decreasing blood supply and oxygen to nerves. Autoimmune. This type of pain results from diseases in which the body's defense system (immune system) mistakenly attacks sensory nerves. Examples of autoimmune diseases that can cause neuropathic pain include lupus and multiple sclerosis. Infectious. Many types of viral infections can damage sensory nerves and cause pain. Shingles infection is a common cause of this type of pain. Inherited. Neuropathic pain can be a symptom of many diseases that are  passed down through families (genetic). What increases the risk? You are more likely to develop this condition if: You have diabetes. You smoke. You drink too much alcohol . You are taking certain medicines, including chemotherapy or medicines that treat immune system disorders. What are the signs or symptoms? The main symptom is pain. Neuropathic pain is often described as: Burning. Shock-like. Stinging. Hot or cold. Itching. How is this diagnosed? No single test can diagnose neuropathic pain. It is diagnosed based on: A physical exam and your symptoms. Your health care provider will ask you about your pain. You may be asked to use a pain scale to describe how bad your pain is. Tests. These may be done to see if you have a cause and location of any nerve damage. They include: Nerve conduction studies and electromyography to test how well nerve signals travel through your nerves and muscles (electrodiagnostic testing). Skin biopsy to evaluate for small fiber neuropathy. Imaging studies, such as: X-rays. CT scan. MRI. How is this treated? Treatment for neuropathic pain may change over time. You may need to try different treatment options or a combination of treatments. Some options include: Treating the underlying cause of the neuropathy, such as diabetes, kidney disease, or vitamin deficiencies. Stopping medicines that can cause neuropathy, such as chemotherapy. Medicine to relieve pain. Medicines may include: Prescription or over-the-counter pain medicine. Anti-seizure medicine. Antidepressant medicines. Pain-relieving patches or creams that are applied to painful areas of skin. A medicine to numb the area (local anesthetic), which can be injected as a nerve block. Transcutaneous nerve stimulation. This uses electrical currents to block painful nerve signals. The treatment is painless. Alternative treatments, such  as: Acupuncture. Meditation. Massage. Occupational or physical  therapy. Pain management programs. Counseling. Follow these instructions at home: Medicines  Take over-the-counter and prescription medicines only as told by your health care provider. Ask your health care provider if the medicine prescribed to you: Requires you to avoid driving or using machinery. Can cause constipation. You may need to take these actions to prevent or treat constipation: Drink enough fluid to keep your urine pale yellow. Take over-the-counter or prescription medicines. Eat foods that are high in fiber, such as beans, whole grains, and fresh fruits and vegetables. Limit foods that are high in fat and processed sugars, such as fried or sweet foods. Lifestyle  Have a good support system at home. Consider joining a chronic pain support group. Do not use any products that contain nicotine or tobacco. These products include cigarettes, chewing tobacco, and vaping devices, such as e-cigarettes. If you need help quitting, ask your health care provider. Do not drink alcohol . General instructions Learn as much as you can about your condition. Work closely with all your health care providers to find the treatment plan that works best for you. Ask your health care provider what activities are safe for you. Keep all follow-up visits. This is important. Contact a health care provider if: Your pain treatments are not working. You are having side effects from your medicines. You are struggling with tiredness (fatigue), mood changes, depression, or anxiety. Get help right away if: You have thoughts of hurting yourself. Get help right away if you feel like you may hurt yourself or others, or have thoughts about taking your own life. Go to your nearest emergency room or: Call 911. Call the National Suicide Prevention Lifeline at 206-826-6283 or 988. This is open 24 hours a day. Text the Crisis Text Line at (727)768-3218. Summary Neuropathic pain is pain caused by damage to the nerves  that are responsible for certain sensations in your body (sensory nerves). Neuropathic pain may come and go as damaged nerves heal, or it may stay at the same level for years. Neuropathic pain is usually a long-term condition that can be difficult to treat. Consider joining a chronic pain support group. This information is not intended to replace advice given to you by your health care provider. Make sure you discuss any questions you have with your health care provider. Document Revised: 03/15/2021 Document Reviewed: 03/15/2021 Elsevier Patient Education  2024 [employer].

## 2024-01-30 DIAGNOSIS — Z6831 Body mass index (BMI) 31.0-31.9, adult: Secondary | ICD-10-CM | POA: Diagnosis not present

## 2024-01-30 DIAGNOSIS — E669 Obesity, unspecified: Secondary | ICD-10-CM | POA: Diagnosis not present

## 2024-01-30 DIAGNOSIS — Z1339 Encounter for screening examination for other mental health and behavioral disorders: Secondary | ICD-10-CM | POA: Diagnosis not present

## 2024-01-30 DIAGNOSIS — Z1331 Encounter for screening for depression: Secondary | ICD-10-CM | POA: Diagnosis not present

## 2024-01-30 DIAGNOSIS — E1121 Type 2 diabetes mellitus with diabetic nephropathy: Secondary | ICD-10-CM | POA: Diagnosis not present

## 2024-01-30 DIAGNOSIS — E039 Hypothyroidism, unspecified: Secondary | ICD-10-CM | POA: Diagnosis not present

## 2024-01-30 DIAGNOSIS — G720 Drug-induced myopathy: Secondary | ICD-10-CM | POA: Diagnosis not present

## 2024-01-30 DIAGNOSIS — I10 Essential (primary) hypertension: Secondary | ICD-10-CM | POA: Diagnosis not present

## 2024-01-30 DIAGNOSIS — D649 Anemia, unspecified: Secondary | ICD-10-CM | POA: Diagnosis not present

## 2024-01-30 DIAGNOSIS — F17200 Nicotine dependence, unspecified, uncomplicated: Secondary | ICD-10-CM | POA: Diagnosis not present

## 2024-01-30 DIAGNOSIS — Z0000 Encounter for general adult medical examination without abnormal findings: Secondary | ICD-10-CM | POA: Diagnosis not present

## 2024-01-30 DIAGNOSIS — R232 Flushing: Secondary | ICD-10-CM | POA: Diagnosis not present

## 2024-01-30 DIAGNOSIS — M25562 Pain in left knee: Secondary | ICD-10-CM | POA: Diagnosis not present

## 2024-01-30 DIAGNOSIS — E785 Hyperlipidemia, unspecified: Secondary | ICD-10-CM | POA: Diagnosis not present

## 2024-01-30 DIAGNOSIS — R82998 Other abnormal findings in urine: Secondary | ICD-10-CM | POA: Diagnosis not present

## 2024-02-01 ENCOUNTER — Telehealth: Payer: Self-pay | Admitting: Pharmacy Technician

## 2024-02-01 NOTE — Telephone Encounter (Signed)
 We received this droppler approval in our fax. I didn't know where it should go, so I put it in media and sending here.

## 2024-02-01 NOTE — Telephone Encounter (Signed)
 Will forward to our billing and pre-cert department for their review.

## 2024-02-06 ENCOUNTER — Telehealth: Payer: Self-pay | Admitting: Podiatry

## 2024-02-06 NOTE — Telephone Encounter (Signed)
 Patient states Tabitha Weaver isn't in her insurance network.Patient requests that provider complete paperwork for diabetic shoes so she can see a provider in network. She is dropping paperwork off at the front desk 02/07/2024.

## 2024-02-08 DIAGNOSIS — Z1151 Encounter for screening for human papillomavirus (HPV): Secondary | ICD-10-CM | POA: Diagnosis not present

## 2024-02-08 DIAGNOSIS — R87625 Unsatisfactory cytologic smear of vagina: Secondary | ICD-10-CM | POA: Diagnosis not present

## 2024-02-08 DIAGNOSIS — Z1272 Encounter for screening for malignant neoplasm of vagina: Secondary | ICD-10-CM | POA: Diagnosis not present

## 2024-02-15 ENCOUNTER — Ambulatory Visit (HOSPITAL_COMMUNITY)
Admission: RE | Admit: 2024-02-15 | Discharge: 2024-02-15 | Disposition: A | Discharge status: Home / Self Care | Source: Ambulatory Visit | Attending: Podiatry | Admitting: Podiatry

## 2024-02-15 DIAGNOSIS — R0989 Other specified symptoms and signs involving the circulatory and respiratory systems: Secondary | ICD-10-CM | POA: Diagnosis not present

## 2024-02-16 ENCOUNTER — Ambulatory Visit: Payer: Self-pay | Admitting: Podiatry

## 2024-02-18 LAB — VAS US ABI WITH/WO TBI
Left ABI: 1.06
Right ABI: 1.04

## 2024-02-20 NOTE — Progress Notes (Signed)
Patient returned call and informed of results.

## 2024-02-20 NOTE — Progress Notes (Signed)
 Left message for patient to return my call.

## 2024-02-28 ENCOUNTER — Ambulatory Visit

## 2024-02-28 DIAGNOSIS — E1149 Type 2 diabetes mellitus with other diabetic neurological complication: Secondary | ICD-10-CM

## 2024-02-28 DIAGNOSIS — M2041 Other hammer toe(s) (acquired), right foot: Secondary | ICD-10-CM

## 2024-02-28 DIAGNOSIS — L84 Corns and callosities: Secondary | ICD-10-CM

## 2024-02-28 NOTE — Progress Notes (Signed)
 Patient presents to the office today for diabetic shoe and insole measuring.  Patient was measured with brannock device to determine size and width for 1 pair of extra depth shoes and foam casted for 3 pair of insoles.   Documentation of medical necessity will be sent to patient's treating diabetic doctor to verify and sign.   Patient's diabetic provider: Glendia Freeman MD  Shoes and insoles will be ordered at that time and patient will be notified for an appointment for fitting when they arrive.   Shoe size (per patient): 10WD Brannock measurement: 10WD Shoe choice:   [id number] / [id number] Shoe size ordered: 10.5W  Patient taking ppw to Dr to have them sign once recvd we need to send for Prior auth

## 2024-02-29 ENCOUNTER — Telehealth: Payer: Self-pay

## 2024-02-29 NOTE — Telephone Encounter (Signed)
 Ppw rcvd exp 05/30/24 faxed to [employer]

## 2024-03-13 ENCOUNTER — Telehealth: Payer: Self-pay

## 2024-03-13 NOTE — Telephone Encounter (Signed)
 [facility] prior auth rcvd and approved  Auth # [id number]  03/01/24-05/30/24  Indexed in media

## 2024-03-20 ENCOUNTER — Ambulatory Visit (INDEPENDENT_AMBULATORY_CARE_PROVIDER_SITE_OTHER)

## 2024-03-20 DIAGNOSIS — M2042 Other hammer toe(s) (acquired), left foot: Secondary | ICD-10-CM | POA: Diagnosis not present

## 2024-03-20 DIAGNOSIS — E1149 Type 2 diabetes mellitus with other diabetic neurological complication: Secondary | ICD-10-CM

## 2024-03-20 DIAGNOSIS — L84 Corns and callosities: Secondary | ICD-10-CM | POA: Diagnosis not present

## 2024-03-20 DIAGNOSIS — M2142 Flat foot [pes planus] (acquired), left foot: Secondary | ICD-10-CM | POA: Diagnosis not present

## 2024-03-20 DIAGNOSIS — M2041 Other hammer toe(s) (acquired), right foot: Secondary | ICD-10-CM | POA: Diagnosis not present

## 2024-03-20 DIAGNOSIS — M2141 Flat foot [pes planus] (acquired), right foot: Secondary | ICD-10-CM

## 2024-03-20 NOTE — Progress Notes (Signed)
 Patient presents today to pick up diabetic shoes and insoles.  Patient was dispensed 1 pair of diabetic shoes and 3 pairs of total contact diabetic insoles. Fit was satisfactory. Instructions for break-in and wear was reviewed and a copy was given to the patient.   Re-appointment for regularly scheduled diabetic foot care visits or if they should experience any trouble with the shoes or insoles.  Britton Cane CPed, CFo, CFm

## 2024-05-03 ENCOUNTER — Encounter: Payer: Self-pay | Admitting: Podiatry

## 2024-05-03 ENCOUNTER — Ambulatory Visit: Admitting: Podiatry

## 2024-05-03 DIAGNOSIS — L84 Corns and callosities: Secondary | ICD-10-CM | POA: Diagnosis not present

## 2024-05-03 DIAGNOSIS — E1149 Type 2 diabetes mellitus with other diabetic neurological complication: Secondary | ICD-10-CM | POA: Diagnosis not present

## 2024-05-03 DIAGNOSIS — M7732 Calcaneal spur, left foot: Secondary | ICD-10-CM

## 2024-05-03 DIAGNOSIS — M7731 Calcaneal spur, right foot: Secondary | ICD-10-CM | POA: Diagnosis not present

## 2024-05-03 DIAGNOSIS — M722 Plantar fascial fibromatosis: Secondary | ICD-10-CM | POA: Diagnosis not present

## 2024-05-03 NOTE — Patient Instructions (Signed)
 You can use VOLTAREN  GEL on the heels   -  For instructions on how to put on your Plantar Fascial Brace, please visit [email or web address]  -  Plantar Fasciitis (Heel Spur Syndrome) with Rehab The plantar fascia is a fibrous, ligament-like, soft-tissue structure that spans the bottom of the foot. Plantar fasciitis is a condition that causes pain in the foot due to inflammation of the tissue. SYMPTOMS  Pain and tenderness on the underneath side of the foot. Pain that worsens with standing or walking. CAUSES  Plantar fasciitis is caused by irritation and injury to the plantar fascia on the underneath side of the foot. Common mechanisms of injury include: Direct trauma to bottom of the foot. Damage to a small nerve that runs under the foot where the main fascia attaches to the heel bone. Stress placed on the plantar fascia due to bone spurs. RISK INCREASES WITH:  Activities that place stress on the plantar fascia (running, jumping, pivoting, or cutting). Poor strength and flexibility. Improperly fitted shoes. Tight calf muscles. Flat feet. Failure to warm-up properly before activity. Obesity. PREVENTION Warm up and stretch properly before activity. Allow for adequate recovery between workouts. Maintain physical fitness: Strength, flexibility, and endurance. Cardiovascular fitness. Maintain a health body weight. Avoid stress on the plantar fascia. Wear properly fitted shoes, including arch supports for individuals who have flat feet.  PROGNOSIS  If treated properly, then the symptoms of plantar fasciitis usually resolve without surgery. However, occasionally surgery is necessary.  RELATED COMPLICATIONS  Recurrent symptoms that may result in a chronic condition. Problems of the lower back that are caused by compensating for the injury, such as limping. Pain or weakness of the foot during push-off following surgery. Chronic inflammation, scarring, and partial or complete  fascia tear, occurring more often from repeated injections.  TREATMENT  Treatment initially involves the use of ice and medication to help reduce pain and inflammation. The use of strengthening and stretching exercises may help reduce pain with activity, especially stretches of the Achilles tendon. These exercises may be performed at home or with a therapist. Your caregiver may recommend that you use heel cups of arch supports to help reduce stress on the plantar fascia. Occasionally, corticosteroid injections are given to reduce inflammation. If symptoms persist for greater than 6 months despite non-surgical (conservative), then surgery may be recommended.   MEDICATION  If pain medication is necessary, then nonsteroidal anti-inflammatory medications, such as aspirin and ibuprofen, or other minor pain relievers, such as acetaminophen, are often recommended. Do not take pain medication within 7 days before surgery. Prescription pain relievers may be given if deemed necessary by your caregiver. Use only as directed and only as much as you need. Corticosteroid injections may be given by your caregiver. These injections should be reserved for the most serious cases, because they may only be given a certain number of times.  HEAT AND COLD Cold treatment (icing) relieves pain and reduces inflammation. Cold treatment should be applied for 10 to 15 minutes every 2 to 3 hours for inflammation and pain and immediately after any activity that aggravates your symptoms. Use ice packs or massage the area with a piece of ice (ice massage). Heat treatment may be used prior to performing the stretching and strengthening activities prescribed by your caregiver, physical therapist, or athletic trainer. Use a heat pack or soak the injury in warm water.  SEEK IMMEDIATE MEDICAL CARE IF: Treatment seems to offer no benefit, or the condition worsens. Any medications produce adverse side  effects.  EXERCISES- RANGE OF  MOTION (ROM) AND STRETCHING EXERCISES - Plantar Fasciitis (Heel Spur Syndrome) These exercises may help you when beginning to rehabilitate your injury. Your symptoms may resolve with or without further involvement from your physician, physical therapist or athletic trainer. While completing these exercises, remember:  Restoring tissue flexibility helps normal motion to return to the joints. This allows healthier, less painful movement and activity. An effective stretch should be held for at least 30 seconds. A stretch should never be painful. You should only feel a gentle lengthening or release in the stretched tissue.  RANGE OF MOTION - Toe Extension, Flexion Sit with your right / left leg crossed over your opposite knee. Grasp your toes and gently pull them back toward the top of your foot. You should feel a stretch on the bottom of your toes and/or foot. Hold this stretch for 10 seconds. Now, gently pull your toes toward the bottom of your foot. You should feel a stretch on the top of your toes and or foot. Hold this stretch for 10 seconds. Repeat  times. Complete this stretch 3 times per day.   RANGE OF MOTION - Ankle Dorsiflexion, Active Assisted Remove shoes and sit on a chair that is preferably not on a carpeted surface. Place right / left foot under knee. Extend your opposite leg for support. Keeping your heel down, slide your right / left foot back toward the chair until you feel a stretch at your ankle or calf. If you do not feel a stretch, slide your bottom forward to the edge of the chair, while still keeping your heel down. Hold this stretch for 10 seconds. Repeat 3 times. Complete this stretch 2 times per day.   STRETCH  Gastroc, Standing Place hands on wall. Extend right / left leg, keeping the front knee somewhat bent. Slightly point your toes inward on your back foot. Keeping your right / left heel on the floor and your knee straight, shift your weight toward the wall, not  allowing your back to arch. You should feel a gentle stretch in the right / left calf. Hold this position for 10 seconds. Repeat 3 times. Complete this stretch 2 times per day.  STRETCH  Soleus, Standing Place hands on wall. Extend right / left leg, keeping the other knee somewhat bent. Slightly point your toes inward on your back foot. Keep your right / left heel on the floor, bend your back knee, and slightly shift your weight over the back leg so that you feel a gentle stretch deep in your back calf. Hold this position for 10 seconds. Repeat 3 times. Complete this stretch 2 times per day.  STRETCH  Gastrocsoleus, Standing  Note: This exercise can place a lot of stress on your foot and ankle. Please complete this exercise only if specifically instructed by your caregiver.  Place the ball of your right / left foot on a step, keeping your other foot firmly on the same step. Hold on to the wall or a rail for balance. Slowly lift your other foot, allowing your body weight to press your heel down over the edge of the step. You should feel a stretch in your right / left calf. Hold this position for 10 seconds. Repeat this exercise with a slight bend in your right / left knee. Repeat 3 times. Complete this stretch 2 times per day.   STRENGTHENING EXERCISES - Plantar Fasciitis (Heel Spur Syndrome)  These exercises may help you when beginning to  rehabilitate your injury. They may resolve your symptoms with or without further involvement from your physician, physical therapist or athletic trainer. While completing these exercises, remember:  Muscles can gain both the endurance and the strength needed for everyday activities through controlled exercises. Complete these exercises as instructed by your physician, physical therapist or athletic trainer. Progress the resistance and repetitions only as guided.  STRENGTH - Towel Curls Sit in a chair positioned on a non-carpeted surface. Place your foot  on a towel, keeping your heel on the floor. Pull the towel toward your heel by only curling your toes. Keep your heel on the floor. Repeat 3 times. Complete this exercise 2 times per day.  STRENGTH - Ankle Inversion Secure one end of a rubber exercise band/tubing to a fixed object (table, pole). Loop the other end around your foot just before your toes. Place your fists between your knees. This will focus your strengthening at your ankle. Slowly, pull your big toe up and in, making sure the band/tubing is positioned to resist the entire motion. Hold this position for 10 seconds. Have your muscles resist the band/tubing as it slowly pulls your foot back to the starting position. Repeat 3 times. Complete this exercises 2 times per day.  Document Released: 07/18/2005 Document Revised: 10/10/2011 Document Reviewed: 10/30/2008 ExitCare Patient Information 2014 Exit[employer], [place].  -  Diabetes Mellitus and Foot Care Diabetes, also called diabetes mellitus, may cause problems with your feet and legs because of poor blood flow (circulation). Poor circulation may make your skin: Become thinner and drier. Break more easily. Heal more slowly. Peel and crack. You may also have nerve damage (neuropathy). This can cause decreased feeling in your legs and feet. This means that you may not notice minor injuries to your feet that could lead to more serious problems. Finding and treating problems early is the best way to prevent future foot problems. How to care for your feet Foot hygiene  Wash your feet daily with warm water and mild soap. Do not use hot water. Then, pat your feet and the areas between your toes until they are fully dry. Do not soak your feet. This can dry your skin. Trim your toenails straight across. Do not dig under them or around the cuticle. File the edges of your nails with an emery board or nail file. Apply a moisturizing lotion or petroleum jelly to the skin on your feet and to dry,  brittle toenails. Use lotion that does not contain alcohol  and is unscented. Do not apply lotion between your toes. Shoes and socks Wear clean socks or stockings every day. Make sure they are not too tight. Do not wear knee-high stockings. These may decrease blood flow to your legs. Wear shoes that fit well and have enough cushioning. Always look in your shoes before you put them on to be sure there are no objects inside. To break in new shoes, wear them for just a few hours a day. This prevents injuries on your feet. Wounds, scrapes, corns, and calluses  Check your feet daily for blisters, cuts, bruises, sores, and redness. If you cannot see the bottom of your feet, use a mirror or ask someone for help. Do not cut off corns or calluses or try to remove them with medicine. If you find a minor scrape, cut, or break in the skin on your feet, keep it and the skin around it clean and dry. You may clean these areas with mild soap and water. Do not  clean the area with peroxide, alcohol , or iodine. If you have a wound, scrape, corn, or callus on your foot, look at it several times a day to make sure it is healing and not infected. Check for: Redness, swelling, or pain. Fluid or blood. Warmth. Pus or a bad smell. General tips Do not cross your legs. This may decrease blood flow to your feet. Do not use heating pads or hot water bottles on your feet. They may burn your skin. If you have lost feeling in your feet or legs, you may not know this is happening until it is too late. Protect your feet from hot and cold by wearing shoes, such as at the beach or on hot pavement. Schedule a complete foot exam at least once a year or more often if you have foot problems. Report any cuts, sores, or bruises to your health care provider right away. Where to find more information American Diabetes Association: diabetes.org Association of Diabetes Care & Education Specialists: diabeteseducator.org Contact a health  care provider if: You have a condition that increases your risk of infection, and you have any cuts, sores, or bruises on your feet. You have an injury that is not healing. You have redness on your legs or feet. You feel burning or tingling in your legs or feet. You have pain or cramps in your legs and feet. Your legs or feet are numb. Your feet always feel cold. You have pain around any toenails. Get help right away if: You have a wound, scrape, corn, or callus on your foot and: You have signs of infection. You have a fever. You have a red line going up your leg. This information is not intended to replace advice given to you by your health care provider. Make sure you discuss any questions you have with your health care provider. Document Revised: 01/19/2022 Document Reviewed: 01/19/2022 Elsevier Patient Education  2024 [employer].

## 2024-05-03 NOTE — Progress Notes (Signed)
  Subjective:  Patient ID: Tabitha Weaver, female    DOB: 1945-08-25,  MRN: [id number]  Chief Complaint  Patient presents with   Foot Pain    Pt. Complains about cold feet and aching heels bilateral. NIDDM A1C 9.   History of Present Illness Tabitha Weaver is a 78 year old female with diabetes who presents with burning and stinging sensations on the tops of her feet, but no significant pain to her heels.  She states that they hurt intermittently but mostly the bottom of the heel when she walks at times.  No open lesions or any injuries.    Objective:    Physical Exam General: AAO x3, NAD  Dermatological:   There is no open lesions identified bilaterally.  Hyperkeratotic lesion along the medial hallux without any underlying ulceration, drainage or signs of infection.  Vascular: Dorsalis Pedis artery and Posterior Tibial artery pedal pulses are 2/4 bilateral with immedate capillary fill time.  There is no pain with calf compression, swelling, warmth, erythema.   Neruologic: Sensation mildly decreased with Semmes Weinstein monofilament  Musculoskeletal: Mild tenderness palpation on plantar aspect of calcaneus on the insertion of the plantar fascia.  There is no pain with lateral compression of calcaneus.  No pain Achilles tendon although equinus is present.  No area pinpoint tenderness.  No edema, erythema.  Gait: Unassisted, Nonantalgic.     No images are attached to the encounter.     LABS Blood Glucose: 154 mg/dL (93/72/7974)    Assessment:   1. Heel spur, right   2. Heel spur, left   3. Decreased pedal pulses   4. Type II diabetes mellitus with neurological manifestations [facility])     Plan:  Patient was evaluated and treated and all questions answered.  Assessment and Plan Assessment & Plan Diabetic neuropathy Burning, stinging, and temperature sensations suggest diabetic neuropathy.  -We can discuss starting gabapentin  or other oral medications but ultimately she went  off on this today.  If symptoms persist or worsen we will reconsider.  Discussed topical medications that she can use.  Type 2 diabetes mellitus Blood sugar at 154 mg/dL. Diabetes may contribute to neuropathy. -She did get diabetic shoes, inserts has been helping. - Discussed daily foot inspection   Heel spur Heel pain resolved. X-ray shows heel spur, but no current pain. - Advised on good arch support and staying active. - Provided worksheet with exercises to stretch before and after exercise. - Dispensed plantar fascia braces to help support and stabilize the plantar fascia bilaterally. - Offered PT  No follow-ups on file.  Donnice JONELLE Fees DPM

## 2024-05-08 ENCOUNTER — Telehealth: Payer: Self-pay | Admitting: Podiatry

## 2024-05-08 NOTE — Telephone Encounter (Signed)
 [facility] precert department called and said they rcvd auth for code [id number]. The auth they rcvd did not have a date and she asks that someone calls her back at [phone]. Thanks

## 2024-05-24 DIAGNOSIS — E785 Hyperlipidemia, unspecified: Secondary | ICD-10-CM | POA: Diagnosis not present

## 2024-05-24 DIAGNOSIS — F17200 Nicotine dependence, unspecified, uncomplicated: Secondary | ICD-10-CM | POA: Diagnosis not present

## 2024-05-24 DIAGNOSIS — E039 Hypothyroidism, unspecified: Secondary | ICD-10-CM | POA: Diagnosis not present

## 2024-05-24 DIAGNOSIS — E1121 Type 2 diabetes mellitus with diabetic nephropathy: Secondary | ICD-10-CM | POA: Diagnosis not present

## 2024-05-24 DIAGNOSIS — G720 Drug-induced myopathy: Secondary | ICD-10-CM | POA: Diagnosis not present

## 2024-05-24 DIAGNOSIS — M25562 Pain in left knee: Secondary | ICD-10-CM | POA: Diagnosis not present

## 2024-05-24 DIAGNOSIS — N3281 Overactive bladder: Secondary | ICD-10-CM | POA: Diagnosis not present

## 2024-05-24 DIAGNOSIS — R32 Unspecified urinary incontinence: Secondary | ICD-10-CM | POA: Diagnosis not present

## 2024-05-24 DIAGNOSIS — I10 Essential (primary) hypertension: Secondary | ICD-10-CM | POA: Diagnosis not present

## 2024-05-24 DIAGNOSIS — D649 Anemia, unspecified: Secondary | ICD-10-CM | POA: Diagnosis not present

## 2024-05-24 DIAGNOSIS — E669 Obesity, unspecified: Secondary | ICD-10-CM | POA: Diagnosis not present

## 2024-05-24 DIAGNOSIS — Z6831 Body mass index (BMI) 31.0-31.9, adult: Secondary | ICD-10-CM | POA: Diagnosis not present
